# Patient Record
Sex: Male | Born: 1955 | Race: White | Hispanic: No | Marital: Married | Smoking: Never smoker
Health system: Southern US, Community
[De-identification: ages and names within clinical notes are randomized; demographics above are authoritative.]

## PROBLEM LIST (undated history)

## (undated) DIAGNOSIS — C801 Malignant (primary) neoplasm, unspecified: Secondary | ICD-10-CM

## (undated) DIAGNOSIS — F419 Anxiety disorder, unspecified: Secondary | ICD-10-CM

## (undated) DIAGNOSIS — M1990 Unspecified osteoarthritis, unspecified site: Secondary | ICD-10-CM

## (undated) HISTORY — PX: OTHER SURGICAL HISTORY: SHX169

## (undated) HISTORY — PX: CHOLECYSTECTOMY: SHX55

---

## 2020-05-24 ENCOUNTER — Other Ambulatory Visit: Payer: Self-pay | Admitting: Urology

## 2020-05-24 ENCOUNTER — Other Ambulatory Visit (HOSPITAL_COMMUNITY): Payer: Self-pay | Admitting: Urology

## 2020-05-24 DIAGNOSIS — C61 Malignant neoplasm of prostate: Secondary | ICD-10-CM

## 2020-06-29 ENCOUNTER — Other Ambulatory Visit: Payer: Self-pay

## 2020-06-29 ENCOUNTER — Encounter (HOSPITAL_COMMUNITY)
Admission: RE | Admit: 2020-06-29 | Discharge: 2020-06-29 | Disposition: A | Discharge status: Home / Self Care | Payer: Managed Care, Other (non HMO) | Source: Ambulatory Visit | Attending: Urology | Admitting: Urology

## 2020-06-29 ENCOUNTER — Ambulatory Visit
Admission: RE | Admit: 2020-06-29 | Discharge: 2020-06-29 | Disposition: A | Discharge status: Home / Self Care | Payer: Managed Care, Other (non HMO) | Source: Ambulatory Visit | Attending: Urology | Admitting: Urology

## 2020-06-29 DIAGNOSIS — C61 Malignant neoplasm of prostate: Secondary | ICD-10-CM | POA: Diagnosis present

## 2020-06-29 MED ORDER — GADOBENATE DIMEGLUMINE 529 MG/ML IV SOLN
19.0000 mL | Freq: Once | INTRAVENOUS | Status: AC | PRN
  Administered 2020-06-29: 19 mL via INTRAVENOUS

## 2020-06-29 MED ORDER — TECHNETIUM TC 99M MEDRONATE IV KIT
20.0000 | PACK | Freq: Once | INTRAVENOUS | Status: AC | PRN
  Administered 2020-06-29: 20 via INTRAVENOUS

## 2020-07-19 ENCOUNTER — Other Ambulatory Visit: Payer: Self-pay | Admitting: Urology

## 2020-07-23 ENCOUNTER — Other Ambulatory Visit: Payer: Self-pay

## 2020-07-23 ENCOUNTER — Encounter: Payer: Self-pay | Admitting: Physical Therapy

## 2020-07-23 ENCOUNTER — Ambulatory Visit: Payer: Managed Care, Other (non HMO) | Attending: Urology | Admitting: Physical Therapy

## 2020-07-23 DIAGNOSIS — R279 Unspecified lack of coordination: Secondary | ICD-10-CM | POA: Diagnosis present

## 2020-07-23 DIAGNOSIS — M6281 Muscle weakness (generalized): Secondary | ICD-10-CM | POA: Diagnosis not present

## 2020-07-23 NOTE — Patient Instructions (Addendum)
When to call the doctor 1. Blood in the urine 2. Increased pain in right leg at night  Bed Positioning after surgery 1. Pillows to support right hip and knee   After surgery- ice to area for 10 min 3 times per day  Self massage to perineum  Sitting posture  Resume: Gluteal squeeze, pelvic floor exercise, transverse abdominal contraction  Prior to surgery -Hamstring stretch -piriformis -butterfly stretch -Bridge -pelvic floor exercise laying down and sitting  Toileting Techniques for Bowel Movements (Defecation)  Using your belly (abdomen) and pelvic floor muscles to have a bowel movement is usually instinctive.  Sometimes people can have problems with these muscles and have to relearn proper defecation (emptying) techniques.  If you have weakness in your muscles, organs that are falling out, decreased sensation in your pelvis, or ignore your urge to go, you may find yourself straining to have a bowel movement.  You are straining if you are:  . holding your breath or taking in a huge gulp of air and holding it  . keeping your lips and jaw tensed and closed tightly . turning red in the face because of excessive pushing or forcing . developing or worsening your  hemorrhoids . getting faint while pushing . not emptying completely and have to defecate many times a day  If you are straining, you are actually making it harder for yourself to have a bowel movement.  Many people find they are pulling up with the pelvic floor muscles and closing off instead of opening the anus. Due to lack pelvic floor relaxation and coordination the abdominal muscles, one has to work harder to push the feces out.  Many people have never been taught how to defecate efficiently and effectively.  Notice what happens to your body when you are having a bowel movement.  While you are sitting on the toilet pay attention to the following areas: . Jaw and mouth position . Angle of your hips   . Whether your feet  touch the ground or not . Arm placement . Spine position . Waist . Belly tension . Anus (opening of the anal canal)  An Evacuation/Defecation Plan   Here are the 4 basic points:  1. Lean forward enough for your elbows to rest on your knees 2. Support your feet on the floor or use a low stool if your feet don't touch the floor  3. Push out your belly as if you have swallowed a beach ball--you should feel a widening of your waist 4. Open and relax your pelvic floor muscles, rather than tightening around the anus   The following conditions my require modifications to your toileting posture:  . If you have had surgery in the past that limits your back, hip, pelvic, knee or ankle flexibility . Constipation   Your healthcare practitioner may make the following additional suggestions and adjustments:  1) Sit on the toilet  a) Make sure your feet are supported. b) Notice your hip angle and spine position--most people find it effective to lean forward or raise their knees, which can help the muscles around the anus to relax  c) When you lean forward, place your forearms on your thighs for support  2) Relax suggestions a) Breath deeply in through your nose and out slowly through your mouth as if you are smelling the flowers and blowing out the candles. b) To become aware of how to relax your muscles, contracting and releasing muscles can be helpful.  Pull your pelvic floor  muscles in tightly by using the image of holding back gas, or closing around the anus (visualize making a circle smaller) and lifting the anus up and in.  Then release the muscles and your anus should drop down and feel open. Repeat 5 times ending with the feeling of relaxation. Concentric Contraction (Hook-Lying)  c) Keep your pelvic floor muscles relaxed; let your belly bulge out. d) The digestive tract starts at the mouth and ends at the anal opening, so be sure to relax both ends of the tube.  Place your tongue on the  roof of your mouth with your teeth separated.  This helps relax your mouth and will help to relax the anus at the same time.  3) Empty (defecation) a) Keep your pelvic floor and sphincter relaxed, then bulge your anal muscles.  Make the anal opening wide.  b) Stick your belly out as if you have swallowed a beach ball. c) Make your belly wall hard using your belly muscles while continuing to breathe. Doing this makes it easier to open your anus. d) Breath out and give a grunt (or try using other sounds such as ahhhh, shhhhh, ohhhh or grrrrrrr).  4) Finish a) As you finish your bowel movement, pull the pelvic floor muscles up and in.  This will leave your anus in the proper place rather than remaining pushed out and down. If you leave your anus pushed out and down, it will start to feel as though that is normal and give you incorrect signals about needing to have a bowel movement.   2007, [employer] Doc.23 Concentric Contraction (Hook-Lying)   Lie with hips and knees bent. Slowly inhale, and then exhale. Pull navel toward spine. Contract pelvic floor. Hold 5 seconds  Relax. Repeat _5__ times. Do _5__ times a day. Perform in sittting 5 times hold 5 seconds 2 times per day  Copyright  VHI. All rights reserved.  Buttocks Squeeze   Sitting, lying or standing, squeeze buttocks together while counting out loud to 5. Repeat _5___ times. Do __5__ sessions per day.  http://gt2.exer.us/561   Copyright  VHI. All rights reserved.    Lay on your back with legs crossed and put ball between ankles. Squeeze for 5 seconds 5x, 2x/day  Hip Stretch   Put right ankle over left knee. Let right knee fall downward, but keep ankle in place. Feel the stretch in hip. May push down gently with hand to feel stretch. Hold _5___ seconds while counting out loud. Repeat with other leg. Do laying on back with knees bent. Repeat __5__ times. Do _2___ sessions per day.  http://gt2.exer.us/497   Hamstring Stretch (Sitting)   Sitting, 2 legs on floor,  extend one leg and place hands on same thigh for support. Keeping torso straight, lean forward, sliding hands down leg, until a stretch is felt in back of thigh. Hold _30___ seconds. 2 times 2 times per day Repeat with other leg.  Copyright  VHI. All rights reserved.    Copyright  VHI. All rights reserved.  External Rotation: Hip - Knees Apart With Pelvic Floor (Hook-Lying)   Lie with hips and knees bent, band tied just above knees. Squeeze pelvic floor while pulling knees apart. Hold for _1__ seconds. Rest for _1__ seconds. Repeat _5__ times. Do _2  Copyright  VHI. All rights reserved. Bridging   Slowly raise buttocks from floor, keeping stomach tight. Repeat _10___ times per set. Do __1__ sets per session. Do __2__ sessions per day. Then place pillow  between knees, contract pelvic floor, lift hips up then down. 10x 2 times per day.  http://orth.exer.us/1096   Copyright  VHI. All rights reserved.   Butterfly, Supine    Lie on back, feet together. Lower knees toward floor. Hold _1 min. Repeat __1_ times per session. Do _1__ sessions per day.  Copyright  VHI. All rights reserved. The Male Pelvic Floor Muscles  The pelvic floor consists of several layers of muscles that cover the bottom of the pelvic cavity. These muscles have several distinct roles:  1. To support the pelvic organs, the bladder and colon within the pelvis. 2. To assist in stopping and starting the flow of urine or the passage of gas or stool. 3. To aid in sexual appreciation.    How to Locate the Pelvic Floor Muscles  The Urine Stop Test . At the midstream of your urine flow, squeeze the pelvic floor muscles. You should feel the sensation of the openings close and the muscles pulling the penis and anus up and in to the pelvic cavity.  If you have strong muscles you will slow or stop the stream of urine. . Try to stop or slow the flow of  urine without tensing the muscles of your legs, buttocks. . Do this only to locate the muscles, not as a daily exercise. Feeling the Muscle . Place a fingertip on or into the rectal opening.  Contract and lift the muscles as though you were holding back gas or a bowel movement.   . You will feel your anal opening tighten and your penis move slightly. Watching the Muscles Contract . Begin by lying on a flat surface.  Position yourself with your knees apart and bent with your head elevated and supported on several pillows.  Use a mirror to look at the anal opening and penis.  . Contract or tighten the muscles around the anal opening and watch for a puckering and lifting of the anus and slight movement of the penis.   . If you see a bulge of your anus this is an incorrect contraction and you should notify your health care provider for more instructions.   2007, [employer] Doc.12 Patient was verbally instructed on bladder irritants and how they can affect the bladder. See handout on Pre-Prostectomy program.  Patient returned all of the above exercises correctly.    Penile Pump Protocol Prior to placing pump on penis, retract the foreskin and shave the pubic hair Place water based lubricant only on the ring.  Place pump over the penis to the base.  Use slow gentle compressions till erection Maintain erection for 5 seconds then release, So for 10-20 erections for 10 min per day for 4 weeks Then progress to  3 minutes on, 1 minute off, 3 repetitions of this sequence for 3 times per week up to 12 months For post-prostatectomy, use as early as 4 weeks post-operatively, up to 12 months  Device may be covered by insurance if billed for "penile rehabilitation"  The device is to stretch and increase strength of penis You Tube vides "Physiotherapy Penile Rehab for Erectile Dysfunction ( use of vacuum pump)  Types of devices Penile pumps  4. Vacurect Vacuum Therapy Erectile  Dysfunction Device  by Vacurect Can get on [employer] .  5. Active Erection System [email or web address].pl   Clamp Protocol 1.  Fit penile clamp-Weisner or Dribblestop 2. Wear all day  during the waking hours ONLY. Do not wear when sleeping. 3. Place clamp just before the  glans penis (head of penis) 4. Wear for a minimum of 2 hours, max 4 hours 5. Wear 6 days/week, with one day off to re-assess 6. Bladder Chart on the day off 7. On day off see how long you can go without leaking but continue urinating 2 hours  8. When weaning off the clamp wear a pad just in case of leakage. 9. Wear 4-6 weeks, , wean off pads 10. May use in combination of medication that doctor may give you

## 2020-07-23 NOTE — Therapy (Addendum)
[facility] [address] [place], [place], [address] Phone: [phone]   Fax:  [phone]  Physical Therapy Evaluation  Patient Details  Name: Walter Miranda MRN: [id number] Date of Birth: 06/13/1956 Referring Provider (PT): Raynelle Bring, MD   Encounter Date: 07/23/2020   PT End of Session - 07/23/20 1722    Visit Number 1    Date for PT Re-Evaluation 11/12/20    Authorization Type cigna 40.00 copay; lives 1-2hrs away    PT Start Time 1533    PT Stop Time 1614    PT Time Calculation (min) 41 min    Activity Tolerance Patient tolerated treatment well    Behavior During Therapy [facility] for tasks assessed/performed           History reviewed. No pertinent past medical history.  Past Surgical History:  Procedure Laterality Date  . galbladder surgery      There were no vitals filed for this visit.    Subjective Assessment - 07/24/20 1525    Subjective Pt is here due to upcoming prostatectomy 08/09/20 and wants to know how to work on strength for best functional outcomes. Pt denies any pain or bowel/bladder symptoms currently.  Pt has chronic low back pain that is not bothering him currently.    Pertinent History DDD, chronic LBP    Patient Stated Goals learn how to strengthen pelvic floor keep from having leakage    Currently in Pain? 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     Pelvic Floor Special Questions - 07/24/20 0001    Urinary Leakage No    Urinary urgency No    Urinary frequency n    External Palpation palpated external to clothing - able to contract and relax; contract and hold x 5 sec    Exam Type Deferred  [facility] PT Treatment/Exercise - 07/24/20 0001      Self-Care   Self-Care Other Self-Care Comments    Other Self-Care Comments  preprostatectomy info, clamp and pump protocol; stretch and exercises edu and performed                  PT Education - 07/24/20 1550    Education Details prostatectomy info, clam and pump info, HEP strengthen and stretch info            PT Short Term Goals - 07/24/20 1538      PT SHORT TERM GOAL #1   Title STG=LTG due to leaving for surgery next 6-8 weeks             PT Long Term Goals - 07/24/20 1527      PT LONG TERM GOAL #1   Title ind with advnaced HEP    Time 16    Period Weeks    Status New    Target Date 11/12/20      PT LONG TERM GOAL #2   Title Pt will report no urinary leakage during typical daily activities    Time 16    Period Weeks    Status New    Target Date 11/12/20      PT LONG TERM GOAL #3   Title Pt will report no flare up of low back pain due to good core and pelvic floor activation     Time 16    Period Weeks    Status New    Target Date 11/12/20      PT LONG TERM GOAL #4   Title Pt will be able to sleep through the night only waking to urinate 1x/night or less    Time 16    Period Weeks    Status New    Target Date 11/12/20      PT LONG TERM GOAL #5   Time --    Period --    Status --    Target Date --                  Plan - 07/24/20 1538    Clinical Impression Statement Pt presents to clinic without knowledge of exercises and things to do pre and post prostatectomy surgery.  He was given handouts and education today. Assessment included hip and back mobility.  Strength and muscle length testing.  Pelvic floor assessment was done outside of clothing.  Pt can perform pelvic floor contraction correctly and relax after doing 5 reps of 5 sec hold. Pt able to feel what he is doing in order to perform pre surgery exercises.  Pt also demonstrates Rt hip abduction and adduction 4+/5; h/s length 80%.  He has rounded shoulders and slumped posture in sitting.  Pt haship rotation Rt side 25% limited and Lt side 20% limited.  Pt has tension in bilateral gluteals.  He will benefit from skilled PT after surgery to address impairments and ensure he is able to maintain stength for continued continence post surgery.    Personal Factors and Comorbidities Comorbidity 3+;Transportation    Comorbidities galbladder surgery; chronic low back pain; DDD; prostate cancer    Examination-Activity Limitations Toileting;Continence    Stability/Clinical Decision Making Evolving/Moderate complexity    Clinical Decision Making Low    Rehab Potential Excellent    PT Frequency 1x / week    PT Duration Other (comment)   16 weeks   PT Treatment/Interventions ADLs/Self Care Home Management;Biofeedback;Cryotherapy;Dealer  Stimulation;Moist Heat;Therapeutic activities;Therapeutic exercise;Neuromuscular re-education;Taping;Manual techniques;Patient/family education;Dry needling;Passive range  of motion    PT Next Visit Plan review HEP and re-assess post surgery as needed    PT Home Exercise Plan HEP provided           Patient will benefit from skilled therapeutic intervention in order to improve the following deficits and impairments:  Pain, Impaired flexibility, Increased fascial restricitons, Decreased strength, Decreased coordination, Decreased range of motion  Visit Diagnosis: Muscle weakness (generalized)  Unspecified lack of coordination     Problem List There are no problems to display for this patient.   Jule Ser, PT 07/24/2020, 5:09 PM  [facility] [address] [place], [place], [address] Phone: [phone]   Fax:  [phone]  Name: Walter Miranda MRN: [id number] Date of Birth: 02-16-1956

## 2020-07-24 NOTE — Addendum Note (Signed)
Addended by: Su Hoff on: 07/24/2020 05:11 PM   Modules accepted: Orders

## 2020-08-02 NOTE — Progress Notes (Addendum)
PCP - Emelda Fear, DO Cardiologist - no  PPM/ICD -  Device Orders -  Rep Notified -   Chest x-ray -  EKG - 08-06-20  Stress Test -  ECHO -  Cardiac Cath -   Sleep Study -  CPAP -   Fasting Blood Sugar -  Checks Blood Sugar _____ times a day  Blood Thinner Instructions: Aspirin Instructions:  ERAS Protcol - PRE-SURGERY Ensure or G2-   COVID TEST- 8-9   Activity -Can do own yard work and walk to [employer] without SOB  Anesthesia review:   Patient denies shortness of breath, fever, cough and chest pain at PAT appointment   All instructions explained to the patient, with a verbal understanding of the material. Patient agrees to go over the instructions while at home for a better understanding. Patient also instructed to self quarantine after being tested for COVID-19. The opportunity to ask questions was provided.

## 2020-08-02 NOTE — Patient Instructions (Addendum)
DUE TO COVID-19 ONLY ONE VISITOR IS ALLOWED TO COME WITH YOU AND STAY IN THE WAITING ROOM ONLY DURING PRE OP AND PROCEDURE DAY OF SURGERY. ONE  VISITOR  MAY VISIT WITH YOU AFTER SURGERY IN YOUR PRIVATE ROOM DURING VISITING HOURS ONLY!  10-a-8p  YOU NEED TO HAVE A COVID 19 TEST ON__8-9-21_____ @_______ , THIS TEST MUST BE DONE BEFORE SURGERY,  COVID TESTING SITE [address] [place] [address], IT IS ON THE RIGHT GOING OUT [address] APPROXIMATELY  2 MINUTES PAST [employer] ON THE RIGHT. ONCE YOUR COVID TEST IS COMPLETED,  PLEASE BEGIN THE QUARANTINE INSTRUCTIONS AS OUTLINED IN YOUR HANDOUT.                Walter Miranda  08/02/2020   Your procedure is scheduled on: 08-09-20   Report to [facility] Main  Entrance   Report to admitting at     0530 AM     Call this number if you have problems the morning of surgery [phone]    Remember:    8 oz of magnesium citrate by noon the day before surgery .                                        And one fleets enema the night before surgery                                         Do not eat food or drink liquids :After Midnight.   BRUSH YOUR TEETH MORNING OF SURGERY AND RINSE YOUR MOUTH OUT, NO CHEWING GUM CANDY OR MINTS.     Take these medicines the morning of surgery with A SIP OF WATER: cymbalta                                 You may not have any metal on your body including hair pins and              piercings  Do not wear jewelry, , lotions, powders or perfumes, deodorant              Men may shave face and neck.   Do not bring valuables to the hospital. [place].  Contacts, dentures or bridgework may not be worn into surgery.      Patients discharged the day of surgery will not be allowed to drive home. IF YOU ARE HAVING SURGERY AND GOING HOME THE SAME DAY, YOU MUST HAVE AN ADULT TO DRIVE YOU HOME AND BE WITH YOU FOR 24 HOURS. YOU MAY GO HOME BY TAXI  OR UBER OR ORTHERWISE, BUT AN ADULT MUST ACCOMPANY YOU HOME AND STAY WITH YOU FOR 24 HOURS.  Name and phone number of your driver:  Special Instructions: N/A              Please read over the following fact sheets you were given: _____________________________________________________________________             [facility] - Preparing for Surgery Before surgery, you can play an important role.  Because skin is not sterile, your skin needs  to be as free of germs as possible.  You can reduce the number of germs on your skin by washing with CHG (chlorahexidine gluconate) soap before surgery.  CHG is an antiseptic cleaner which kills germs and bonds with the skin to continue killing germs even after washing. Please DO NOT use if you have an allergy to CHG or antibacterial soaps.  If your skin becomes reddened/irritated stop using the CHG and inform your nurse when you arrive at Short Stay. Do not shave (including legs and underarms) for at least 48 hours prior to the first CHG shower.  You may shave your face/neck. Please follow these instructions carefully:  1.  Shower with CHG Soap the night before surgery and the  morning of Surgery.  2.  If you choose to wash your hair, wash your hair first as usual with your  normal  shampoo.  3.  After you shampoo, rinse your hair and body thoroughly to remove the  shampoo.                           4.  Use CHG as you would any other liquid soap.  You can apply chg directly  to the skin and wash                       Gently with a scrungie or clean washcloth.  5.  Apply the CHG Soap to your body ONLY FROM THE NECK DOWN.   Do not use on face/ open                           Wound or open sores. Avoid contact with eyes, ears mouth and genitals (private parts).                       Wash face,  Genitals (private parts) with your normal soap.             6.  Wash thoroughly, paying special attention to the area where your surgery  will be performed.  7.   Thoroughly rinse your body with warm water from the neck down.  8.  DO NOT shower/wash with your normal soap after using and rinsing off  the CHG Soap.                9.  Pat yourself dry with a clean towel.            10.  Wear clean pajamas.            11.  Place clean sheets on your bed the night of your first shower and do not  sleep with pets. Day of Surgery : Do not apply any lotions/deodorants the morning of surgery.  Please wear clean clothes to the hospital/surgery center.  FAILURE TO FOLLOW THESE INSTRUCTIONS MAY RESULT IN THE CANCELLATION OF YOUR SURGERY PATIENT SIGNATURE_________________________________  NURSE SIGNATURE__________________________________  ________________________________________________________________________   Walter Miranda  An incentive spirometer is a tool that can help keep your lungs clear and active. This tool measures how well you are filling your lungs with each breath. Taking long deep breaths may help reverse or decrease the chance of developing breathing (pulmonary) problems (especially infection) following:  A long period of time when you are unable to move or be active. BEFORE THE PROCEDURE   If the spirometer includes an indicator to show your  best effort, your nurse or respiratory therapist will set it to a desired goal.  If possible, sit up straight or lean slightly forward. Try not to slouch.  Hold the incentive spirometer in an upright position. INSTRUCTIONS FOR USE  1. Sit on the edge of your bed if possible, or sit up as far as you can in bed or on a chair. 2. Hold the incentive spirometer in an upright position. 3. Breathe out normally. 4. Place the mouthpiece in your mouth and seal your lips tightly around it. 5. Breathe in slowly and as deeply as possible, raising the piston or the ball toward the top of the column. 6. Hold your breath for 3-5 seconds or for as long as possible. Allow the piston or ball to fall to the bottom  of the column. 7. Remove the mouthpiece from your mouth and breathe out normally. 8. Rest for a few seconds and repeat Steps 1 through 7 at least 10 times every 1-2 hours when you are awake. Take your time and take a few normal breaths between deep breaths. 9. The spirometer may include an indicator to show your best effort. Use the indicator as a goal to work toward during each repetition. 10. After each set of 10 deep breaths, practice coughing to be sure your lungs are clear. If you have an incision (the cut made at the time of surgery), support your incision when coughing by placing a pillow or rolled up towels firmly against it. Once you are able to get out of bed, walk around indoors and cough well. You may stop using the incentive spirometer when instructed by your caregiver.  RISKS AND COMPLICATIONS  Take your time so you do not get dizzy or light-headed.  If you are in pain, you may need to take or ask for pain medication before doing incentive spirometry. It is harder to take a deep breath if you are having pain. AFTER USE  Rest and breathe slowly and easily.  It can be helpful to keep track of a log of your progress. Your caregiver can provide you with a simple table to help with this. If you are using the spirometer at home, follow these instructions: [place] IF:   You are having difficultly using the spirometer.  You have trouble using the spirometer as often as instructed.  Your pain medication is not giving enough relief while using the spirometer.  You develop fever of 100.5 F (38.1 C) or higher. SEEK IMMEDIATE MEDICAL CARE IF:   You cough up bloody sputum that had not been present before.  You develop fever of 102 F (38.9 C) or greater.  You develop worsening pain at or near the incision site. MAKE SURE YOU:   Understand these instructions.  Will watch your condition.  Will get help right away if you are not doing well or get worse. Document  Released: 04/27/2007 Document Revised: 03/08/2012 Document Reviewed: 06/28/2007 [facility] Patient Information 2014 [place], [place].   ________________________________________________________________________

## 2020-08-06 ENCOUNTER — Other Ambulatory Visit: Payer: Self-pay

## 2020-08-06 ENCOUNTER — Other Ambulatory Visit (HOSPITAL_COMMUNITY)
Admission: RE | Admit: 2020-08-06 | Discharge: 2020-08-06 | Disposition: A | Discharge status: Home / Self Care | Payer: Managed Care, Other (non HMO) | Source: Ambulatory Visit | Attending: Urology | Admitting: Urology

## 2020-08-06 ENCOUNTER — Encounter (HOSPITAL_COMMUNITY)
Admission: RE | Admit: 2020-08-06 | Discharge: 2020-08-06 | Disposition: A | Discharge status: Home / Self Care | Payer: Managed Care, Other (non HMO) | Source: Ambulatory Visit | Attending: Urology | Admitting: Urology

## 2020-08-06 ENCOUNTER — Encounter (HOSPITAL_COMMUNITY): Payer: Self-pay

## 2020-08-06 DIAGNOSIS — Z20822 Contact with and (suspected) exposure to covid-19: Secondary | ICD-10-CM | POA: Diagnosis not present

## 2020-08-06 DIAGNOSIS — Z01818 Encounter for other preprocedural examination: Secondary | ICD-10-CM | POA: Insufficient documentation

## 2020-08-06 HISTORY — DX: Anxiety disorder, unspecified: F41.9

## 2020-08-06 HISTORY — DX: Unspecified osteoarthritis, unspecified site: M19.90

## 2020-08-06 HISTORY — DX: Malignant (primary) neoplasm, unspecified: C80.1

## 2020-08-06 LAB — CBC
HCT: 42.7 % (ref 39.0–52.0)
Hemoglobin: 14.6 g/dL (ref 13.0–17.0)
MCH: 34 pg (ref 26.0–34.0)
MCHC: 34.2 g/dL (ref 30.0–36.0)
MCV: 99.5 fL (ref 80.0–100.0)
Platelets: 185 10*3/uL (ref 150–400)
RBC: 4.29 MIL/uL (ref 4.22–5.81)
RDW: 13.7 % (ref 11.5–15.5)
WBC: 5.9 10*3/uL (ref 4.0–10.5)
nRBC: 0 % (ref 0.0–0.2)

## 2020-08-06 LAB — BASIC METABOLIC PANEL
Anion gap: 9 (ref 5–15)
BUN: 15 mg/dL (ref 8–23)
CO2: 27 mmol/L (ref 22–32)
Calcium: 9.2 mg/dL (ref 8.9–10.3)
Chloride: 105 mmol/L (ref 98–111)
Creatinine, Ser: 0.95 mg/dL (ref 0.61–1.24)
GFR calc Af Amer: >60 mL/min (ref >60)
GFR calc non Af Amer: >60 mL/min (ref >60)
Glucose, Bld: 102 mg/dL — ABNORMAL HIGH (ref 70–99)
Potassium: 4.1 mmol/L (ref 3.5–5.1)
Sodium: 141 mmol/L (ref 135–145)

## 2020-08-06 LAB — SARS CORONAVIRUS 2 (TAT 6-24 HRS): SARS Coronavirus 2: NEGATIVE

## 2020-08-08 NOTE — H&P (Signed)
Office Visit Report     07/17/2020   --------------------------------------------------------------------------------   Walter Miranda  MRN: [id number]  DOB: 06/14/1956, 64 year old Male  SSN: [id number]   PRIMARY CARE:  Mel Almond, DO  REFERRING:  Aloha Gell. Hurt, MD  PROVIDER:  Raynelle Bring, M.D.  LOCATION:  [facility] [phone]     --------------------------------------------------------------------------------   CC/HPI: Prostate cancer   PCP: Dr. Cordella Register   Walter Miranda is a 64 year old gentleman who originally saw me in August of 2016 in consultation for low risk prostate cancer. At that time, he had undergone a biopsy on 07/20/2015 for an elevated PSA of a 8.06. His digital rectal exam was normal. 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 We had an extensive discussion regarding options for management. He had undergone staging studies in [place] by Dr. Lerry Liner including a CT scan of the abdomen and pelvis and bone scan with no evidence of obvious metastatic disease. Ultimately, he did not proceed with additional urologic follow-up but has simply been undergoing PSA monitoring via his primary care physician. His PSA recently increased to 20.22 on 04/06/2020 prompting further urologic evaluation in May 2021. After further discussion, it was recommended that he undergo additional staging including an MRI of the prostate/pelvis and bone scan. His bone scan was negative for obvious metastatic disease. His MRI indicated evidence of a PI-RADS 5 lesion on the left side of the prostate with EPE and involvement of the NVB most likely. No SVI or LAD or bone lesions were noted.   He is potentially interested in proceeding with treatment if indicated. Of note, his recently had presyncopal episodes in his undergone an extensive evaluation including an MRI of the brain, carotid artery ultrasound, and echocardiogram. These tests were  all unremarkable for an explanation for these symptoms.   Past medical history: Rheumatoid arthritis treated with Enbrel and followed by Dr. Leigh Aurora.  Past surgical history: He has a history of a laparoscopic cholecystectomy.   Urinary function: IPSS is 9.  Erectile function: SHIM score is 18.     ALLERGIES: No Allergies    MEDICATIONS: Allergy Relief  Enbrel     GU PSH: No GU PSH      PSH Notes: Cholecystectomy, Wrist Surgery   NON-GU PSH: Cholecystectomy (open) - 2016     GU PMH: History of prostate cancer, History of prostate cancer - 2016 Prostate Cancer, Prostate cancer - 2016    NON-GU PMH: Personal history of other diseases of the musculoskeletal system and connective tissue, History of arthritis - 2016, History of rheumatoid arthritis, - 2016 Arthritis Asthma    FAMILY HISTORY: Prostate Cancer - Runs In Family   SOCIAL HISTORY: Marital Status: Married Preferred Language: English; Ethnicity: Not Hispanic Or Latino; Race: White Current Smoking Status: Patient has never smoked.   Tobacco Use Assessment Completed: Used Tobacco in last 30 days? Drinks 2 drinks per day. Social Drinker.  Drinks 1 caffeinated drink per day.     Notes: Married, Occupation, Two children, Never a smoker, Alcohol use   REVIEW OF SYSTEMS:    GU Review Male:   Patient denies frequent urination, hard to postpone urination, burning/ pain with urination, get up at night to urinate, leakage of urine, stream starts and stops, trouble starting your streams, and have to strain to urinate .  Gastrointestinal (Upper):   Patient denies nausea and vomiting.  Gastrointestinal (Lower):   Patient  denies diarrhea and constipation.  Constitutional:   Patient denies fever, night sweats, weight loss, and fatigue.  Skin:   Patient denies skin rash/ lesion and itching.  Eyes:   Patient denies blurred vision and double vision.  Ears/ Nose/ Throat:   Patient denies sore throat and sinus problems.   Hematologic/Lymphatic:   Patient denies swollen glands and easy bruising.  Cardiovascular:   Patient denies leg swelling and chest pains.  Respiratory:   Patient denies cough and shortness of breath.  Endocrine:   Patient denies excessive thirst.  Musculoskeletal:   Patient denies back pain and joint pain.  Neurological:   Patient denies dizziness and headaches.  Psychologic:   Patient denies depression and anxiety.   VITAL SIGNS:      07/17/2020 08:10 AM  Weight 195 lb / 88.45 kg  Height 72 in / 182.88 cm  BP 132/72 mmHg  Pulse 72 /min  Temperature 97.3 F / 36.2 C  BMI 26.4 kg/m   MULTI-SYSTEM PHYSICAL EXAMINATION:    Constitutional: Well-nourished. No physical deformities. Normally developed. Good grooming.  Neck: Neck symmetrical, not swollen. Normal tracheal position.  Respiratory: No labored breathing, no use of accessory muscles. Clear bilaterally.  Cardiovascular: Normal temperature, normal extremity pulses, no swelling, no varicosities. Regular rate and rhythm.  Lymphatic: No enlargement of neck, axillae, groin.  Skin: No paleness, no jaundice, no cyanosis. No lesion, no ulcer, no rash.  Neurologic / Psychiatric: Oriented to time, oriented to place, oriented to person. No depression, no anxiety, no agitation.  Gastrointestinal: No mass, no tenderness, no rigidity, non obese abdomen. Small well-healed laparoscopic incisions.  Eyes: Normal conjunctivae. Normal eyelids.  Ears, Nose, Mouth, and Throat: Left ear no scars, no lesions, no masses. Right ear no scars, no lesions, no masses. Nose no scars, no lesions, no masses. Normal hearing. Normal lips.  Musculoskeletal: Normal gait and station of head and neck.     Complexity of Data:  Lab Test Review:   PSA  Records Review:   Pathology Reports, Previous Patient Records  X-Ray Review: MRI Prostate GSORAD: Reviewed Films.  Bone Scan: Reviewed Films.     05/22/20  PSA  Total PSA 21.40 ng/mL   Notes:                      CLINICAL DATA: Prostate carcinoma, Gleason score 3+3=6. Active  surveillance.   EXAM:  MR PROSTATE WITHOUT AND WITH CONTRAST   TECHNIQUE:  Multiplanar multisequence MRI images were obtained of the pelvis  centered about the prostate. Pre and post contrast images were  obtained.   CONTRAST: 39mL MULTIHANCE GADOBENATE DIMEGLUMINE 529 MG/ML IV SOLN   COMPARISON: None.   FINDINGS:  Prostate:   -- Peripheral Zone: Ill-defined T2 hypointense nodule is seen in the  left lateral apex and mid gland, which measures 1.7 by 1.3 x 0.8 cm.  This shows marked ADC hypointensity, moderate DWI hyperintensity,  and early focal contrast enhancement. No other suspicious peripheral  zone nodules are identified.   -- Transition/Central Zone: Normal appearance.   -- Measurements/Volume: 3.4 x 2.6 x 3.5 cm (volume = 16 cm^3)   Transcapsular spread: Extracapsular extension is seen in the left  posterolateral mid gland and apex.   Seminal vesicle involvement: Absent   Neurovascular bundle involvement: Probable involvement of the left  neurovascular bundle.   Pelvic adenopathy: None visualized   Bone metastasis: None visualized   Other: None   IMPRESSION:  1.7 cm peripheral zone nodule in  the left lateral apex and mid  gland, highly suspicious for high-grade carcinoma. PI-RADS 5: Very  high (clinically significant cancer is highly likely to be present).   Suspect left-sided extracapsular extension, and involvement of the  left neurovascular bundle.   No evidence of pelvic lymphadenopathy or bone metastases.   (I have processed this exam in the DynaCAD application for MR/US  fusion-guided biopsy if performed.)    Electronically Signed  By: Marlaine Hind M.D.  On: 07/04/2020 14:41   CLINICAL DATA: Prostate cancer. PSA is 21.4 on 05/23/2020.   EXAM:  NUCLEAR MEDICINE WHOLE BODY BONE SCAN   TECHNIQUE:  Whole body anterior and posterior images were obtained approximately  3 hours  after intravenous injection of radiopharmaceutical.   RADIOPHARMACEUTICALS: 18.5 mCi Technetium-60m MDP IV   COMPARISON: None   FINDINGS:  Symmetric activity within both kidneys. Activity within the bladder.  Small focus of increased osseous remodeling is identified along the  LEFT posterior 6th rib, at the costovertebral junction. The findings  are possibly related to degenerative changes. Other mildly increased  areas of increased osseous remodeling identified in the shoulders  and LEFT wrist, are likely degenerative.   IMPRESSION:  1. Focal activity in the LEFT posterior 6th costovertebral junction  is favored to represent degenerative changes.  2. No definitive evidence for osseous metastatic disease.    Electronically Signed  By: Nolon Nations M.D.  On: 06/30/2020 10:41   PROCEDURES:          Urinalysis - 81003 Dipstick Dipstick Cont'd  Color: Yellow Bilirubin: Neg mg/dL  Appearance: Clear Ketones: Neg mg/dL  Specific Gravity: 1.030 Blood: Neg ery/uL  pH: 6.0 Protein: Trace mg/dL  Glucose: Neg mg/dL Urobilinogen: 0.2 mg/dL    Nitrites: Neg    Leukocyte Esterase: Neg leu/uL    ASSESSMENT:      ICD-10 Details  1 GU:   Prostate Cancer - C61    PLAN:           Schedule Return Visit/Planned Activity: Other See Visit Notes             Note: Will call to schedule surgery  Return Visit/Planned Activity: Next Available Appointment - PT Referral             Note: Please schedule patient for preoperative PT prior to radical prostatectomy.            Document Letter(s):  Created for Patient: Clinical Summary         Notes:   1. Locally advanced prostate cancer: We reviewed his bone scan an MRI which fortunately do not demonstrate evidence of metastatic disease that do raise concern for clinically significant cancer with evidence of probable extraprostatic extension on the left consistent with locally advanced disease. As such, I have recommended that he proceed with  curative therapy at this point.   The patient was counseled about the natural history of prostate cancer and the standard treatment options that are available for prostate cancer. It was explained to him how his age and life expectancy, clinical stage, Gleason score, and PSA affect his prognosis, the decision to proceed with additional staging studies, as well as how that information influences recommended treatment strategies. We discussed the roles for active surveillance, radiation therapy, surgical therapy, androgen deprivation, as well as ablative therapy options for the treatment of prostate cancer as appropriate to his individual cancer situation. We discussed the risks and benefits of these options with regard to their impact on cancer control and  also in terms of potential adverse events, complications, and impact on quality of life particularly related to urinary and sexual function. The patient was encouraged to ask questions throughout the discussion today and all questions were answered to his stated satisfaction. In addition, the patient was provided with and/or directed to appropriate resources and literature for further education about prostate cancer and treatment options. We discussed surgical therapy for prostate cancer including the different available surgical approaches. We discussed, in detail, the risks and expectations of surgery with regard to cancer control, urinary control, and erectile function as well as the expected postoperative recovery process. Additional risks of surgery including but not limited to bleeding, infection, hernia formation, nerve damage, lymphocele formation, bowel/rectal injury potentially necessitating colostomy, damage to the urinary tract resulting in urine leakage, urethral stricture, and the cardiopulmonary risks such as myocardial infarction, stroke, death, venothromboembolism, etc. were explained. The risk of open surgical conversion for robotic/laparoscopic  prostatectomy was also discussed.   He does wish to proceed with surgical therapy. We did discuss the options including primary surgical therapy or long-term androgen deprivation with radiation. I did offer him a radiation oncology consultation but he has been very adamant about his decision to proceed with surgery. As such, he will be tentatively scheduled for a unilateral right nerve-sparing robot assisted laparoscopic radical prostatectomy and bilateral pelvic lymphadenectomy.   I will plan to talk with Dr. Amil Amen about perioperative management of his Enbrel but I imagine we would likely hold this for approximately 2 weeks preoperatively and postoperatively with plans to use alternative therapies for his rheumatoid arthritis around that time frame.   Cc: Dr. Emelda Fear  Dr. Leigh Aurora     E & M CODES: We spent 55 minutes dedicated to evaluation and management time, including face to face interaction, discussions on coordination of care, documentation, result review, and discussion with others as applicable.     * Signed by Raynelle Bring, M.D. on 07/17/20 at 3:27 PM (EDT)*       APPENDED NOTES:  I spoke with Dr. Amil Amen. Will plan to hold Enbrel 2 weeks prior to surgery and 2 weeks after. He can use anti-inflammatories as needed in between.     * Signed by Raynelle Bring, M.D. on 07/17/20 at 4:01 PM (EDT)*

## 2020-08-08 NOTE — Anesthesia Preprocedure Evaluation (Addendum)
Anesthesia Evaluation  Patient identified by MRN, date of birth, ID band Patient awake    Reviewed: Allergy & Precautions, H&P , NPO status , Patient's Chart, lab work & pertinent test results  Airway Mallampati: II  TM Distance: >3 FB Neck ROM: Full    Dental no notable dental hx. (+) Teeth Intact, Dental Advisory Given   Pulmonary neg pulmonary ROS,    Pulmonary exam normal breath sounds clear to auscultation       Cardiovascular Exercise Tolerance: Good negative cardio ROS   Rhythm:Regular Rate:Normal     Neuro/Psych Anxiety negative neurological ROS     GI/Hepatic negative GI ROS, Neg liver ROS,   Endo/Other  negative endocrine ROS  Renal/GU negative Renal ROS  negative genitourinary   Musculoskeletal  (+) Arthritis , Osteoarthritis,    Abdominal   Peds  Hematology negative hematology ROS (+)   Anesthesia Other Findings   Reproductive/Obstetrics negative OB ROS                            Anesthesia Physical Anesthesia Plan  ASA: II  Anesthesia Plan: General   Post-op Pain Management:    Induction: Intravenous  PONV Risk Score and Plan: 3 and Ondansetron, Dexamethasone and Midazolam  Airway Management Planned: Oral ETT  Additional Equipment:   Intra-op Plan:   Post-operative Plan: Extubation in OR  Informed Consent: I have reviewed the patients History and Physical, chart, labs and discussed the procedure including the risks, benefits and alternatives for the proposed anesthesia with the patient or authorized representative who has indicated his/her understanding and acceptance.       Plan Discussed with: CRNA and Surgeon  Anesthesia Plan Comments:        Anesthesia Quick Evaluation

## 2020-08-09 ENCOUNTER — Encounter (HOSPITAL_COMMUNITY): Payer: Self-pay | Admitting: Urology

## 2020-08-09 ENCOUNTER — Other Ambulatory Visit: Payer: Self-pay

## 2020-08-09 ENCOUNTER — Ambulatory Visit (HOSPITAL_COMMUNITY): Payer: Managed Care, Other (non HMO) | Admitting: Anesthesiology

## 2020-08-09 ENCOUNTER — Observation Stay (HOSPITAL_COMMUNITY)
Admission: RE | Admit: 2020-08-09 | Discharge: 2020-08-10 | Disposition: A | Discharge status: Home / Self Care | Payer: Managed Care, Other (non HMO) | Attending: Urology | Admitting: Urology

## 2020-08-09 ENCOUNTER — Encounter (HOSPITAL_COMMUNITY)
Admission: RE | Disposition: A | Discharge status: Home / Self Care | Payer: Self-pay | Source: Home / Self Care | Attending: Urology

## 2020-08-09 DIAGNOSIS — J45909 Unspecified asthma, uncomplicated: Secondary | ICD-10-CM | POA: Insufficient documentation

## 2020-08-09 DIAGNOSIS — C61 Malignant neoplasm of prostate: Secondary | ICD-10-CM | POA: Diagnosis present

## 2020-08-09 HISTORY — PX: ROBOT ASSISTED LAPAROSCOPIC RADICAL PROSTATECTOMY: SHX5141

## 2020-08-09 HISTORY — PX: LYMPHADENECTOMY: SHX5960

## 2020-08-09 LAB — TYPE AND SCREEN
ABO/RH(D): O POS
Antibody Screen: NEGATIVE

## 2020-08-09 LAB — ABO/RH: ABO/RH(D): O POS

## 2020-08-09 LAB — HEMOGLOBIN AND HEMATOCRIT, BLOOD
HCT: 37.5 % — ABNORMAL LOW (ref 39.0–52.0)
Hemoglobin: 12.6 g/dL — ABNORMAL LOW (ref 13.0–17.0)

## 2020-08-09 SURGERY — XI ROBOTIC ASSISTED LAPAROSCOPIC RADICAL PROSTATECTOMY LEVEL 2
Anesthesia: General | Site: Abdomen

## 2020-08-09 MED ORDER — FENTANYL CITRATE (PF) 100 MCG/2ML IJ SOLN
INTRAMUSCULAR | Status: AC
  Filled 2020-08-09: qty 2

## 2020-08-09 MED ORDER — LACTATED RINGERS IV SOLN
INTRAVENOUS | Status: DC | PRN
  Administered 2020-08-09: 1 mL

## 2020-08-09 MED ORDER — BELLADONNA ALKALOIDS-OPIUM 16.2-60 MG RE SUPP: 1.0000 | Freq: Four times a day (QID) | RECTAL | Status: DC | PRN

## 2020-08-09 MED ORDER — ONDANSETRON HCL 4 MG/2ML IJ SOLN
4.0000 mg | INTRAMUSCULAR | Status: DC | PRN
  Filled 2020-08-09: qty 2

## 2020-08-09 MED ORDER — MIDAZOLAM HCL 2 MG/2ML IJ SOLN
INTRAMUSCULAR | Status: AC
  Filled 2020-08-09: qty 2

## 2020-08-09 MED ORDER — TRAMADOL HCL 50 MG PO TABS: 50.0000 mg | ORAL_TABLET | Freq: Four times a day (QID) | ORAL | 0 refills | Status: DC | PRN

## 2020-08-09 MED ORDER — ROCURONIUM BROMIDE 10 MG/ML (PF) SYRINGE
PREFILLED_SYRINGE | INTRAVENOUS | Status: AC
  Filled 2020-08-09: qty 10

## 2020-08-09 MED ORDER — SODIUM CHLORIDE 0.9 % IR SOLN
Status: DC | PRN
  Administered 2020-08-09: 1000 mL

## 2020-08-09 MED ORDER — CEFAZOLIN SODIUM-DEXTROSE 2-4 GM/100ML-% IV SOLN
2.0000 g | Freq: Once | INTRAVENOUS | Status: AC
  Administered 2020-08-09: 2 g via INTRAVENOUS
  Filled 2020-08-09: qty 100

## 2020-08-09 MED ORDER — EPHEDRINE SULFATE-NACL 50-0.9 MG/10ML-% IV SOSY
PREFILLED_SYRINGE | INTRAVENOUS | Status: DC | PRN
  Administered 2020-08-09 (×2): 10 mg via INTRAVENOUS
  Administered 2020-08-09 (×2): 5 mg via INTRAVENOUS

## 2020-08-09 MED ORDER — ZOLPIDEM TARTRATE 5 MG PO TABS: 5.0000 mg | ORAL_TABLET | Freq: Every evening | ORAL | Status: DC | PRN

## 2020-08-09 MED ORDER — ONDANSETRON HCL 4 MG/2ML IJ SOLN
INTRAMUSCULAR | Status: DC | PRN
  Administered 2020-08-09: 4 mg via INTRAVENOUS

## 2020-08-09 MED ORDER — CEFAZOLIN SODIUM-DEXTROSE 1-4 GM/50ML-% IV SOLN
1.0000 g | Freq: Three times a day (TID) | INTRAVENOUS | Status: AC
  Administered 2020-08-09 – 2020-08-10 (×2): 1 g via INTRAVENOUS
  Filled 2020-08-09 (×2): qty 50

## 2020-08-09 MED ORDER — LACTATED RINGERS IV SOLN: INTRAVENOUS | Status: DC

## 2020-08-09 MED ORDER — BUPIVACAINE-EPINEPHRINE 0.25% -1:200000 IJ SOLN
INTRAMUSCULAR | Status: DC | PRN
  Administered 2020-08-09: 30 mL

## 2020-08-09 MED ORDER — CHLORHEXIDINE GLUCONATE CLOTH 2 % EX PADS
6.0000 | MEDICATED_PAD | Freq: Every day | CUTANEOUS | Status: DC
  Administered 2020-08-09 – 2020-08-10 (×2): 6 via TOPICAL

## 2020-08-09 MED ORDER — DULOXETINE HCL 60 MG PO CPEP
60.0000 mg | ORAL_CAPSULE | Freq: Every day | ORAL | Status: DC
  Administered 2020-08-10: 60 mg via ORAL
  Filled 2020-08-09: qty 1

## 2020-08-09 MED ORDER — MIDAZOLAM HCL 2 MG/2ML IJ SOLN
INTRAMUSCULAR | Status: DC | PRN
  Administered 2020-08-09: 2 mg via INTRAVENOUS

## 2020-08-09 MED ORDER — BUPIVACAINE-EPINEPHRINE (PF) 0.25% -1:200000 IJ SOLN
INTRAMUSCULAR | Status: AC
  Filled 2020-08-09: qty 30

## 2020-08-09 MED ORDER — CHLORHEXIDINE GLUCONATE 0.12 % MT SOLN
15.0000 mL | Freq: Once | OROMUCOSAL | Status: AC
  Administered 2020-08-09: 15 mL via OROMUCOSAL

## 2020-08-09 MED ORDER — ACETAMINOPHEN 325 MG PO TABS: 650.0000 mg | ORAL_TABLET | ORAL | Status: DC | PRN

## 2020-08-09 MED ORDER — FENTANYL CITRATE (PF) 250 MCG/5ML IJ SOLN
INTRAMUSCULAR | Status: AC
  Filled 2020-08-09: qty 5

## 2020-08-09 MED ORDER — MORPHINE SULFATE (PF) 4 MG/ML IV SOLN
2.0000 mg | INTRAVENOUS | Status: DC | PRN
  Administered 2020-08-09: 3 mg via INTRAVENOUS
  Administered 2020-08-09 (×2): 2 mg via INTRAVENOUS
  Filled 2020-08-09 (×3): qty 1

## 2020-08-09 MED ORDER — LIDOCAINE HCL (CARDIAC) PF 100 MG/5ML IV SOSY
PREFILLED_SYRINGE | INTRAVENOUS | Status: DC | PRN
  Administered 2020-08-09: 60 mg via INTRAVENOUS

## 2020-08-09 MED ORDER — MAGNESIUM CITRATE PO SOLN: 1.0000 | Freq: Once | ORAL | Status: DC

## 2020-08-09 MED ORDER — ROCURONIUM BROMIDE 10 MG/ML (PF) SYRINGE
PREFILLED_SYRINGE | INTRAVENOUS | Status: DC | PRN
  Administered 2020-08-09: 20 mg via INTRAVENOUS
  Administered 2020-08-09 (×2): 10 mg via INTRAVENOUS
  Administered 2020-08-09: 60 mg via INTRAVENOUS

## 2020-08-09 MED ORDER — HYDRALAZINE HCL 20 MG/ML IJ SOLN
INTRAMUSCULAR | Status: DC | PRN
  Administered 2020-08-09 (×2): 5 mg via INTRAVENOUS

## 2020-08-09 MED ORDER — DOCUSATE SODIUM 100 MG PO CAPS
100.0000 mg | ORAL_CAPSULE | Freq: Two times a day (BID) | ORAL | Status: DC
  Administered 2020-08-09 – 2020-08-10 (×2): 100 mg via ORAL
  Filled 2020-08-09 (×2): qty 1

## 2020-08-09 MED ORDER — DIPHENHYDRAMINE HCL 50 MG/ML IJ SOLN: 12.5000 mg | Freq: Four times a day (QID) | INTRAMUSCULAR | Status: DC | PRN

## 2020-08-09 MED ORDER — SUGAMMADEX SODIUM 200 MG/2ML IV SOLN
INTRAVENOUS | Status: DC | PRN
  Administered 2020-08-09: 180 mg via INTRAVENOUS

## 2020-08-09 MED ORDER — BACITRACIN-NEOMYCIN-POLYMYXIN 400-5-5000 EX OINT: 1.0000 "application " | TOPICAL_OINTMENT | Freq: Three times a day (TID) | CUTANEOUS | Status: DC | PRN

## 2020-08-09 MED ORDER — KCL IN DEXTROSE-NACL 20-5-0.45 MEQ/L-%-% IV SOLN
INTRAVENOUS | Status: DC
  Filled 2020-08-09 (×3): qty 1000

## 2020-08-09 MED ORDER — LIDOCAINE 2% (20 MG/ML) 5 ML SYRINGE
INTRAMUSCULAR | Status: AC
  Filled 2020-08-09: qty 5

## 2020-08-09 MED ORDER — ACETAMINOPHEN 500 MG PO TABS
1000.0000 mg | ORAL_TABLET | Freq: Once | ORAL | Status: AC
  Administered 2020-08-09: 1000 mg via ORAL
  Filled 2020-08-09: qty 2

## 2020-08-09 MED ORDER — HYDROMORPHONE HCL 1 MG/ML IJ SOLN
0.2500 mg | INTRAMUSCULAR | Status: DC | PRN
  Administered 2020-08-09: 0.5 mg via INTRAVENOUS

## 2020-08-09 MED ORDER — DIPHENHYDRAMINE HCL 12.5 MG/5ML PO ELIX: 12.5000 mg | ORAL_SOLUTION | Freq: Four times a day (QID) | ORAL | Status: DC | PRN

## 2020-08-09 MED ORDER — KETOROLAC TROMETHAMINE 15 MG/ML IJ SOLN
15.0000 mg | Freq: Four times a day (QID) | INTRAMUSCULAR | Status: DC
  Administered 2020-08-09 – 2020-08-10 (×4): 15 mg via INTRAVENOUS
  Filled 2020-08-09 (×4): qty 1

## 2020-08-09 MED ORDER — HEPARIN SODIUM (PORCINE) 1000 UNIT/ML IJ SOLN
INTRAMUSCULAR | Status: AC
  Filled 2020-08-09: qty 1

## 2020-08-09 MED ORDER — SODIUM CHLORIDE 0.9 % IV BOLUS
1000.0000 mL | Freq: Once | INTRAVENOUS | Status: AC
  Administered 2020-08-09: 1000 mL via INTRAVENOUS

## 2020-08-09 MED ORDER — HYDROMORPHONE HCL 1 MG/ML IJ SOLN
INTRAMUSCULAR | Status: AC
  Filled 2020-08-09: qty 1

## 2020-08-09 MED ORDER — HYDRALAZINE HCL 20 MG/ML IJ SOLN
INTRAMUSCULAR | Status: AC
  Filled 2020-08-09: qty 1

## 2020-08-09 MED ORDER — PROPOFOL 10 MG/ML IV BOLUS
INTRAVENOUS | Status: DC | PRN
  Administered 2020-08-09: 150 mg via INTRAVENOUS

## 2020-08-09 MED ORDER — EPHEDRINE 5 MG/ML INJ
INTRAVENOUS | Status: AC
  Filled 2020-08-09: qty 10

## 2020-08-09 MED ORDER — FENTANYL CITRATE (PF) 250 MCG/5ML IJ SOLN
INTRAMUSCULAR | Status: DC | PRN
  Administered 2020-08-09 (×3): 50 ug via INTRAVENOUS
  Administered 2020-08-09 (×2): 100 ug via INTRAVENOUS

## 2020-08-09 MED ORDER — PROPOFOL 10 MG/ML IV BOLUS
INTRAVENOUS | Status: AC
  Filled 2020-08-09: qty 40

## 2020-08-09 MED ORDER — SULFAMETHOXAZOLE-TRIMETHOPRIM 800-160 MG PO TABS
1.0000 | ORAL_TABLET | Freq: Two times a day (BID) | ORAL | 0 refills | Status: DC
Start: 2020-08-09 — End: 2022-05-29

## 2020-08-09 MED ORDER — FLEET ENEMA 7-19 GM/118ML RE ENEM: 1.0000 | ENEMA | Freq: Once | RECTAL | Status: DC

## 2020-08-09 MED ORDER — DEXAMETHASONE SODIUM PHOSPHATE 10 MG/ML IJ SOLN
INTRAMUSCULAR | Status: DC | PRN
  Administered 2020-08-09: 5 mg via INTRAVENOUS

## 2020-08-09 MED ORDER — ORAL CARE MOUTH RINSE: 15.0000 mL | Freq: Once | OROMUCOSAL | Status: AC

## 2020-08-09 SURGICAL SUPPLY — 59 items
APPLICATOR COTTON TIP 6 STRL (MISCELLANEOUS) ×2 IMPLANT
APPLICATOR COTTON TIP 6IN STRL (MISCELLANEOUS) ×4
CATH FOLEY 2WAY SLVR 18FR 30CC (CATHETERS) ×4 IMPLANT
CATH ROBINSON RED A/P 16FR (CATHETERS) ×4 IMPLANT
CATH ROBINSON RED A/P 8FR (CATHETERS) ×4 IMPLANT
CATH TIEMANN FOLEY 18FR 5CC (CATHETERS) ×4 IMPLANT
CHLORAPREP W/TINT 26 (MISCELLANEOUS) ×4 IMPLANT
CLIP VESOLOCK LG 6/CT PURPLE (CLIP) ×8 IMPLANT
COVER SURGICAL LIGHT HANDLE (MISCELLANEOUS) ×4 IMPLANT
COVER TIP SHEARS 8 DVNC (MISCELLANEOUS) ×2 IMPLANT
COVER TIP SHEARS 8MM DA VINCI (MISCELLANEOUS) ×2
COVER WAND RF STERILE (DRAPES) IMPLANT
CUTTER ECHEON FLEX ENDO 45 340 (ENDOMECHANICALS) ×4 IMPLANT
DECANTER SPIKE VIAL GLASS SM (MISCELLANEOUS) ×4 IMPLANT
DERMABOND ADVANCED (GAUZE/BANDAGES/DRESSINGS) ×2
DERMABOND ADVANCED .7 DNX12 (GAUZE/BANDAGES/DRESSINGS) ×2 IMPLANT
DRAIN CHANNEL RND F F (WOUND CARE) ×4 IMPLANT
DRAPE ARM DVNC X/XI (DISPOSABLE) ×8 IMPLANT
DRAPE COLUMN DVNC XI (DISPOSABLE) ×2 IMPLANT
DRAPE DA VINCI XI ARM (DISPOSABLE) ×8
DRAPE DA VINCI XI COLUMN (DISPOSABLE) ×2
DRAPE SURG IRRIG POUCH 19X23 (DRAPES) ×4 IMPLANT
DRSG TEGADERM 4X4.75 (GAUZE/BANDAGES/DRESSINGS) ×4 IMPLANT
ELECT REM PT RETURN 15FT ADLT (MISCELLANEOUS) ×4 IMPLANT
GLOVE BIO SURGEON STRL SZ 6.5 (GLOVE) ×3 IMPLANT
GLOVE BIO SURGEONS STRL SZ 6.5 (GLOVE) ×1
GLOVE BIOGEL M STRL SZ7.5 (GLOVE) ×8 IMPLANT
GOWN STRL REUS W/TWL LRG LVL3 (GOWN DISPOSABLE) ×12 IMPLANT
HOLDER FOLEY CATH W/STRAP (MISCELLANEOUS) ×4 IMPLANT
IRRIG SUCT STRYKERFLOW 2 WTIP (MISCELLANEOUS) ×4
IRRIGATION SUCT STRKRFLW 2 WTP (MISCELLANEOUS) ×2 IMPLANT
IV LACTATED RINGERS 1000ML (IV SOLUTION) ×4 IMPLANT
KIT TURNOVER KIT A (KITS) IMPLANT
NDL SAFETY ECLIPSE 18X1.5 (NEEDLE) ×2 IMPLANT
NEEDLE HYPO 18GX1.5 SHARP (NEEDLE) ×2
PACK ROBOT UROLOGY CUSTOM (CUSTOM PROCEDURE TRAY) ×4 IMPLANT
PENCIL SMOKE EVACUATOR (MISCELLANEOUS) IMPLANT
SEAL CANN UNIV 5-8 DVNC XI (MISCELLANEOUS) ×8 IMPLANT
SEAL XI 5MM-8MM UNIVERSAL (MISCELLANEOUS) ×8
SET IRRIG Y TYPE TUR BLADDER L (SET/KITS/TRAYS/PACK) ×4 IMPLANT
SET TUBE SMOKE EVAC HIGH FLOW (TUBING) ×4 IMPLANT
SOLUTION ELECTROLUBE (MISCELLANEOUS) ×4 IMPLANT
STAPLE RELOAD 45 GRN (STAPLE) ×2 IMPLANT
STAPLE RELOAD 45MM GREEN (STAPLE) ×2
SUT ETHILON 3 0 PS 1 (SUTURE) ×4 IMPLANT
SUT MNCRL 3 0 RB1 (SUTURE) ×2 IMPLANT
SUT MNCRL 3 0 VIOLET RB1 (SUTURE) ×2 IMPLANT
SUT MNCRL AB 4-0 PS2 18 (SUTURE) ×8 IMPLANT
SUT MONOCRYL 3 0 RB1 (SUTURE) ×4
SUT VIC AB 0 CT1 27 (SUTURE) ×2
SUT VIC AB 0 CT1 27XBRD ANTBC (SUTURE) ×2 IMPLANT
SUT VIC AB 0 UR5 27 (SUTURE) ×4 IMPLANT
SUT VIC AB 2-0 SH 27 (SUTURE) ×2
SUT VIC AB 2-0 SH 27X BRD (SUTURE) ×2 IMPLANT
SUT VICRYL 0 UR6 27IN ABS (SUTURE) ×8 IMPLANT
SYR 27GX1/2 1ML LL SAFETY (SYRINGE) ×4 IMPLANT
TOWEL OR NON WOVEN STRL DISP B (DISPOSABLE) ×4 IMPLANT
TROCAR XCEL NON-BLD 5MMX100MML (ENDOMECHANICALS) IMPLANT
WATER STERILE IRR 1000ML POUR (IV SOLUTION) ×4 IMPLANT

## 2020-08-09 NOTE — Progress Notes (Signed)
Patient ID: Walter Miranda, male   DOB: 1956-04-19, 64 y.o.   MRN: [id number]  Post-op note  Subjective: The patient is doing well.  No complaints.  Objective: Vital signs in last 24 hours: Temp:  [97.7 F (36.5 C)-98.2 F (36.8 C)] 97.7 F (36.5 C) (08/12 1529) Pulse Rate:  [77-81] 78 (08/12 1529) Resp:  [11-18] 18 (08/12 1529) BP: (113-166)/(75-93) 140/80 (08/12 1529) SpO2:  [99 %-100 %] 100 % (08/12 1529) Weight:  [89.1 kg] 89.1 kg (08/12 0618)  Intake/Output from previous day: No intake/output data recorded. Intake/Output this shift: Total I/O In: 1200 [I.V.:1200] Out: 1135 [Urine:1000; Drains:60; Blood:75]  Physical Exam:  General: Alert and oriented. Abdomen: Soft, Nondistended. Incisions: Clean and dry. GU: Urine clear.  Lab Results: Recent Labs    08/09/20 1152  HGB 12.6*  HCT 37.5*    Assessment/Plan: POD#0   1) Continue to monitor, ambulate, IS   Pryor Curia. MD   LOS: 0 days   Dutch Gray 08/09/2020, 5:09 PM

## 2020-08-09 NOTE — Transfer of Care (Signed)
Immediate Anesthesia Transfer of Care Note  Patient: Bracen Schum  Procedure(s) Performed: XI ROBOTIC ASSISTED LAPAROSCOPIC RADICAL PROSTATECTOMY LEVEL 2 (N/A Abdomen) LYMPHADENECTOMY, PELVIC (Bilateral )  Patient Location: PACU  Anesthesia Type:General  Level of Consciousness: awake, alert , oriented and patient cooperative  Airway & Oxygen Therapy: Patient Spontanous Breathing and Patient connected to face mask oxygen  Post-op Assessment: Report given to RN and Post -op Vital signs reviewed and stable  Post vital signs: Reviewed and stable  Last Vitals:  Vitals Value Taken Time  BP 141/76 08/09/20 1123  Temp    Pulse 80 08/09/20 1124  Resp 12 08/09/20 1124  SpO2 100 % 08/09/20 1124  Vitals shown include unvalidated device data.  Last Pain:  Vitals:   08/09/20 0618  TempSrc:   PainSc: 0-No pain      Patients Stated Pain Goal: 4 (25/27/12 9290)  Complications: No complications documented.

## 2020-08-09 NOTE — Interval H&P Note (Signed)
History and Physical Interval Note:  08/09/2020 6:43 AM  Walter Miranda  has presented today for surgery, with the diagnosis of PROSTATE CANCER.  The various methods of treatment have been discussed with the patient and family. After consideration of risks, benefits and other options for treatment, the patient has consented to  Procedure(s): XI ROBOTIC ASSISTED LAPAROSCOPIC RADICAL PROSTATECTOMY LEVEL 2 (N/A) LYMPHADENECTOMY, PELVIC (Bilateral) as a surgical intervention.  The patient's history has been reviewed, patient examined, no change in status, stable for surgery.  I have reviewed the patient's chart and labs.  Questions were answered to the patient's satisfaction.     [employer]

## 2020-08-09 NOTE — Anesthesia Postprocedure Evaluation (Signed)
Anesthesia Post Note  Patient: Walter Miranda  Procedure(s) Performed: XI ROBOTIC ASSISTED LAPAROSCOPIC RADICAL PROSTATECTOMY LEVEL 2 (N/A Abdomen) LYMPHADENECTOMY, PELVIC (Bilateral )     Patient location during evaluation: PACU Anesthesia Type: General Level of consciousness: awake and alert Pain management: pain level controlled Vital Signs Assessment: post-procedure vital signs reviewed and stable Respiratory status: spontaneous breathing, nonlabored ventilation, respiratory function stable and patient connected to nasal cannula oxygen Cardiovascular status: blood pressure returned to baseline and stable Postop Assessment: no apparent nausea or vomiting Anesthetic complications: no   No complications documented.  Last Vitals:  Vitals:   08/09/20 1200 08/09/20 1300  BP: 137/75 113/75  Pulse: 77 80  Resp: 12 11  Temp:    SpO2: 100% 100%    Last Pain:  Vitals:   08/09/20 1300  TempSrc:   PainSc: Asleep                 [name],W. EDMOND

## 2020-08-09 NOTE — Op Note (Signed)
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 Robotic assisted laparoscopic radical prostatectomy (right nerve sparing) 2. Bilateral robotic assisted laparoscopic pelvic lymphadenectomy  Surgeon: Pryor Curia. M.D.  Assistant(s): Debbrah Alar, PA-C   An assistant was required for this surgical procedure.  The duties of the assistant included but were not limited to suctioning, passing suture, camera manipulation, retraction. This procedure would not be able to be performed without an Environmental consultant.   Resident: Dr. Carmie Kanner  Anesthesia: General  Complications: None  EBL: 75 mL  IVF:  1200 mL crystalloid  Specimens: 1. Prostate and seminal vesicles 2. Right pelvic lymph nodes 3. Left pelvic lymph nodes  Disposition of specimens: Pathology  Drains: 1. 20 Fr coude catheter 2. # 19 Blake pelvic drain  Indication: Walter Miranda is a 64 y.o. patient with clinically localized prostate cancer.  After a thorough review of the management options for treatment of prostate cancer, he elected to proceed with surgical therapy and the above procedure(s).  We have discussed the potential benefits and risks of the procedure, side effects of the proposed treatment, the likelihood of the patient achieving the goals of the procedure, and any potential problems that might occur during the procedure or recuperation. Informed consent has been obtained.  Description of procedure:  The patient was taken to the operating room and a general anesthetic was administered. He was given preoperative antibiotics, placed in the dorsal lithotomy position, and prepped and draped in the usual sterile fashion. Next a preoperative timeout was performed. A urethral catheter was placed into the bladder and a site was selected near  the umbilicus for placement of the camera port. This was placed using a standard open Hassan technique which allowed entry into the peritoneal cavity under direct vision and without difficulty. An 8 mm port was placed and a pneumoperitoneum established. The camera was then used to inspect the abdomen and there was no evidence of any intra-abdominal injuries or other abnormalities. The remaining abdominal ports were then placed. 8 mm robotic ports were placed in the right lower quadrant, left lower quadrant, and far left lateral abdominal wall. A 5 mm port was placed in the right upper quadrant and a 12 mm port was placed in the right lateral abdominal wall for laparoscopic assistance. All ports were placed under direct vision without difficulty. The surgical cart was then docked.   Utilizing the cautery scissors, the bladder was reflected posteriorly allowing entry into the space of Retzius and identification of the endopelvic fascia and prostate. The periprostatic fat was then removed from the prostate allowing full exposure of the endopelvic fascia. The endopelvic fascia was then incised from the apex back to the base of the prostate bilaterally and the underlying levator muscle fibers were swept laterally off the prostate thereby isolating the dorsal venous complex. The dorsal vein was then stapled and divided with a 45 mm Flex Echelon stapler. Attention then turned to the bladder neck which was divided anteriorly thereby allowing entry into the bladder and exposure of the urethral catheter. The catheter balloon was deflated and the catheter was brought into the operative field and used to retract the prostate anteriorly. The posterior bladder neck was then examined and was divided allowing further dissection between the bladder and prostate posteriorly until the vasa deferentia and seminal vessels were identified. The vasa deferentia were isolated, divided,  and lifted anteriorly. The seminal vesicles were  dissected down to their tips with care to control the seminal vascular arterial blood supply. These structures were then lifted anteriorly and the space between Denonvillier's fascia and the anterior rectum was developed with a combination of sharp and blunt dissection. This isolated the vascular pedicles of the prostate.  The lateral prostatic fascia on the right side of the prostate was then sharply incised allowing release of the neurovascular bundle. The vascular pedicle of the prostate on the right side was then ligated with Weck clips between the prostate and neurovascular bundle and divided with sharp cold scissor dissection resulting in neurovascular bundle preservation. On the left side, a wide non nerve sparing dissection was performed with Weck clips used to ligate the vascular pedicle of the prostate. The neurovascular bundle on the right side was then separated off the apex of the prostate and urethra.  The urethra was then sharply transected allowing the prostate specimen to be disarticulated. The pelvis was copiously irrigated and hemostasis was ensured. There was no evidence for rectal injury.  Attention then turned to the right pelvic sidewall. The fibrofatty tissue between the external iliac vein, confluence of the iliac vessels, hypogastric artery, and Cooper's ligament was dissected free from the pelvic sidewall with care to preserve the obturator nerve. Weck clips were used for lymphostasis and hemostasis. An identical procedure was performed on the contralateral side and the lymphatic packets were removed for permanent pathologic analysis.  Attention then turned to the urethral anastomosis. A 2-0 Vicryl slip knot was placed between Denonvillier's fascia, the posterior bladder neck, and the posterior urethra to reapproximate these structures. A double-armed 3-0 Monocryl suture was then used to perform a 360 running tension-free anastomosis between the bladder neck and urethra. A new  urethral catheter was then placed into the bladder and irrigated. There were no blood clots within the bladder and the anastomosis appeared to be watertight. A #19 Blake drain was then brought through the left lateral 8 mm port site and positioned appropriately within the pelvis. It was secured to the skin with a nylon suture. The surgical cart was then undocked. The right lateral 12 mm port site was closed at the fascial level with a 0 Vicryl suture placed laparoscopically. All remaining ports were then removed under direct vision. The prostate specimen was removed intact within the Endopouch retrieval bag via the periumbilical camera port site. This fascial opening was closed with two running 0 Vicryl sutures. 0.25% Marcaine was then injected into all port sites and all incisions were reapproximated at the skin level with 4-0 Monocryl subcuticular sutures. Dermabond was applied. The patient appeared to tolerate the procedure well and without complications. The patient was able to be extubated and transferred to the recovery unit in satisfactory condition.   Pryor Curia MD

## 2020-08-09 NOTE — Anesthesia Procedure Notes (Signed)
Procedure Name: Intubation Date/Time: 08/09/2020 7:25 AM Performed by: Raenette Rover, CRNA Pre-anesthesia Checklist: Patient identified, Emergency Drugs available, Suction available and Patient being monitored Patient Re-evaluated:Patient Re-evaluated prior to induction Oxygen Delivery Method: Circle system utilized Preoxygenation: Pre-oxygenation with 100% oxygen Induction Type: IV induction Ventilation: Mask ventilation without difficulty and Oral airway inserted - appropriate to patient size Laryngoscope Size: Mac and 4 Grade View: Grade I Tube type: Oral Tube size: 7.0 mm Number of attempts: 1 Airway Equipment and Method: Stylet Placement Confirmation: ETT inserted through vocal cords under direct vision,  positive ETCO2 and breath sounds checked- equal and bilateral Secured at: 22 cm Tube secured with: Tape Dental Injury: Teeth and Oropharynx as per pre-operative assessment

## 2020-08-09 NOTE — Discharge Instructions (Signed)
1. Activity:  You are encouraged to ambulate frequently (about every hour during waking hours) to help prevent blood clots from forming in your legs or lungs.  However, you should not engage in any heavy lifting (> 10-15 lbs), strenuous activity, or straining. 2. Diet: You should continue a clear liquid diet until passing gas from below.  Once this occurs, you may advance your diet to a soft diet that would be easy to digest (i.e soups, scrambled eggs, mashed potatoes, etc.) for 24 hours just as you would if getting over a bad stomach flu.  If tolerating this diet well for 24 hours, you may then begin eating regular food.  It will be normal to have some amount of bloating, nausea, and abdominal discomfort intermittently. 3. Prescriptions:  You will be provided a prescription for pain medication to take as needed.  If your pain is not severe enough to require the prescription pain medication, you may take Tylenol instead.  You should also take an over the counter stool softener (Colace 100 mg twice daily) to avoid straining with bowel movements as the pain medication may constipate you. Finally, you will also be provided a prescription for an antibiotic to begin the day prior to your return visit in the office for catheter removal. 4. Catheter care: You will be taught how to take care of the catheter by the nursing staff prior to discharge from the hospital.  You may use both a leg bag and the larger bedside bag but it is recommended to at least use the bigger bedside bag at nighttime as the leg bag is small and will fill up overnight and also does not drain as well when lying flat. You may periodically feel a strong urge to void with the catheter in place.  This is a bladder spasm and most often can occur when having a bowel movement or when you are moving around. It is typically self-limited and usually will stop after a few minutes.  You may use some Vaseline or Neosporin around the tip of the catheter to  reduce friction at the tip of the penis. 5. Incisions: You may remove your dressing bandages the 2nd day after surgery.  You most likely will have a few small staples in each of the incisions and once the bandages are removed, the incisions may stay open to air.  You may start showering (not soaking or bathing in water) 48 hours after surgery and the incisions simply need to be patted dry after the shower.  No additional care is needed. 6. What to call us about: You should call the office [phone]) if you develop fever > 101, persistent vomiting, or the catheter stops draining. Also, feel free to call with any other questions you may have and remember the handout that was provided to you as a reference preoperatively which answers many of the common questions that arise after surgery. 7. You may resume aspirin, advil, aleve, vitamins, and supplements 7 days after surgery.  8. You may resume Enbrel 2 weeks after surgery

## 2020-08-10 ENCOUNTER — Encounter (HOSPITAL_COMMUNITY): Payer: Self-pay | Admitting: Urology

## 2020-08-10 DIAGNOSIS — C61 Malignant neoplasm of prostate: Secondary | ICD-10-CM | POA: Diagnosis not present

## 2020-08-10 LAB — HEMOGLOBIN AND HEMATOCRIT, BLOOD
HCT: 37.2 % — ABNORMAL LOW (ref 39.0–52.0)
Hemoglobin: 12.5 g/dL — ABNORMAL LOW (ref 13.0–17.0)

## 2020-08-10 MED ORDER — BISACODYL 10 MG RE SUPP
10.0000 mg | Freq: Once | RECTAL | Status: AC
  Administered 2020-08-10: 10 mg via RECTAL
  Filled 2020-08-10: qty 1

## 2020-08-10 MED ORDER — TRAMADOL HCL 50 MG PO TABS
50.0000 mg | ORAL_TABLET | Freq: Four times a day (QID) | ORAL | Status: DC | PRN
  Administered 2020-08-10: 50 mg via ORAL
  Administered 2020-08-10: 100 mg via ORAL
  Filled 2020-08-10: qty 2
  Filled 2020-08-10: qty 1

## 2020-08-10 NOTE — Discharge Summary (Signed)
Date of admission: 08/09/2020  Date of discharge: 08/10/2020  Admission diagnosis: Prostate Cancer  Discharge diagnosis: Prostate Cancer  History and Physical: For full details, please see admission history and physical. Briefly, Walter Miranda is a 63 y.o. gentleman with localized prostate cancer.  After discussing management/treatment options, he elected to proceed with surgical treatment.  Hospital Course: Walter Miranda was taken to the operating room on 08/09/2020 and underwent a robotic assisted laparoscopic radical prostatectomy. He tolerated this procedure well and without complications. Postoperatively, he was able to be transferred to a regular hospital room following recovery from anesthesia.  He was able to begin ambulating the night of surgery. He remained hemodynamically stable overnight.  He had excellent urine output with appropriately minimal output from his pelvic drain and his pelvic drain was removed on POD #1.  He was transitioned to oral pain medication, tolerated a clear liquid diet, and had met all discharge criteria and was able to be discharged home later on POD#1.  Laboratory values:  Recent Labs    08/09/20 1152 08/10/20 0514  HGB 12.6* 12.5*  HCT 37.5* 37.2*    Disposition: Home  Discharge instruction: He was instructed to be ambulatory but to refrain from heavy lifting, strenuous activity, or driving. He was instructed on urethral catheter care.  Discharge medications:   Allergies as of 08/10/2020   No Known Allergies     Medication List    STOP taking these medications   Enbrel SureClick 50 MG/ML injection Generic drug: etanercept   naproxen sodium 220 MG tablet Commonly known as: ALEVE     TAKE these medications   DULoxetine 60 MG capsule Commonly known as: CYMBALTA Take 60 mg by mouth daily.   sulfamethoxazole-trimethoprim 800-160 MG tablet Commonly known as: BACTRIM DS Take 1 tablet by mouth 2 (two) times daily. Start the day prior to  foley removal appointment   traMADol 50 MG tablet Commonly known as: Ultram Take 1-2 tablets (50-100 mg total) by mouth every 6 (six) hours as needed for moderate pain or severe pain.       Followup: He will followup in 1 week for catheter removal and to discuss his surgical pathology results.  

## 2020-08-10 NOTE — Progress Notes (Signed)
Patient ID: Walter Miranda, male   DOB: 06-09-1956, 64 y.o.   MRN: [id number]  1 Day Post-Op Subjective: The patient is doing well.  No nausea or vomiting. Pain is adequately controlled.  Objective: Vital signs in last 24 hours: Temp:  [97.7 F (36.5 C)-98.8 F (37.1 C)] 98.4 F (36.9 C) (08/13 0600) Pulse Rate:  [57-81] 57 (08/13 0600) Resp:  [11-18] 16 (08/13 0600) BP: (105-141)/(57-89) 108/79 (08/13 0600) SpO2:  [99 %-100 %] 100 % (08/13 0600)  Intake/Output from previous day: 08/12 0701 - 08/13 0700 In: 3235.1 [P.O.:860; I.V.:2275.1; IV Piggyback:100] Out: 3600 [Urine:3400; Drains:125; Blood:75] Intake/Output this shift: Total I/O In: 1704.9 [P.O.:740; I.V.:914.9; IV Piggyback:50] Out: 2465 [Urine:2400; Drains:65]  Physical Exam:  General: Alert and oriented. CV: RRR Lungs: Clear bilaterally. GI: Soft, Nondistended. Incisions: Clean, dry, and intact Urine: Clear Extremities: Nontender, no erythema, no edema.  Lab Results: Recent Labs    08/09/20 1152 08/10/20 0514  HGB 12.6* 12.5*  HCT 37.5* 37.2*      Assessment/Plan: POD# 1 s/p robotic prostatectomy.  1) SL IVF 2) Ambulate, Incentive spirometry 3) Transition to oral pain medication 4) Dulcolax suppository 5) D/C pelvic drain 6) Plan for likely discharge later today   Pryor Curia. MD   LOS: 0 days   Dutch Gray 08/10/2020, 6:57 AM

## 2020-08-10 NOTE — Progress Notes (Signed)
Patient discharging home.  IV removed -WNL.  Reviewed AVS and medications with patients and wife.  Follow up in place for 8/20.  Educated patient and wife on catheter management at home - wife able to demonstrate changing bags out successfully.  Encouraged patient to drink plenty of water, to ambulate frequently, and continue use if IS.  Patient and wife verbalize understanding - no questions at this time.  Patient in NAD at time of discharge

## 2020-08-14 LAB — SURGICAL PATHOLOGY

## 2020-11-04 IMAGING — NM NM BONE WHOLE BODY
2 series · 2 of 2 positions shown · non-contrast
Comparison: None

CLINICAL DATA: Prostate cancer.  PSA is 21.4 on 05/23/2020.

EXAM:
NUCLEAR MEDICINE WHOLE BODY BONE SCAN
TECHNIQUE: Whole body anterior and posterior images were obtained approximately
3 hours after intravenous injection of radiopharmaceutical.
RADIOPHARMACEUTICALS:  18.5 mCi Oechnetium-66m MDP IV

[Series 1: wbr_bone_40 whole body · 2.66mm/px · 1 of 1 slices shown (1 of 2)]
[im 1/1]
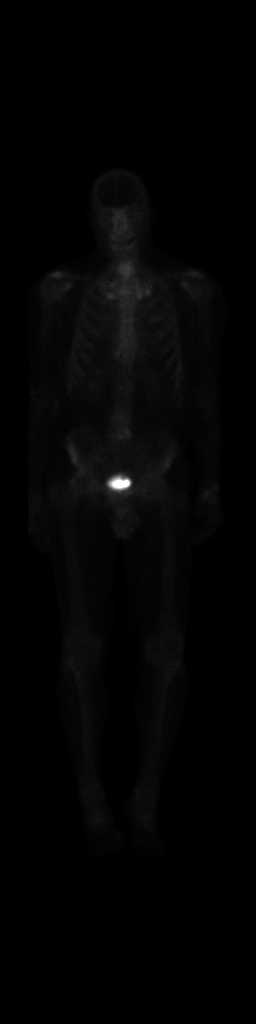

[Series 1: wbr_bone_40 whole body · 2.66mm/px · 1 of 1 slices shown (2 of 2)]
[im 1/1]
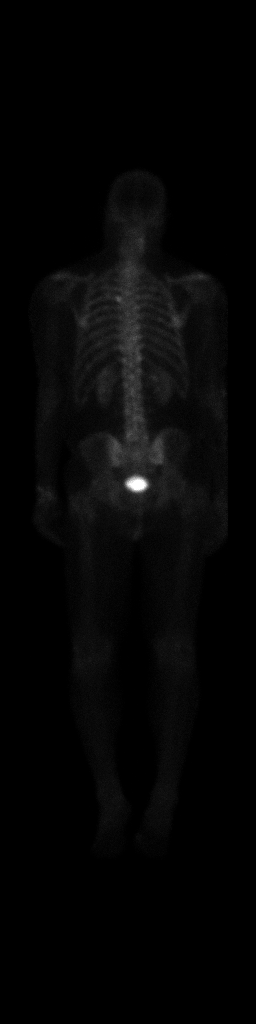

[2 of 2 positions shown; findings below may reference images not displayed]

FINDINGS: Symmetric activity within both kidneys. Activity within the bladder.
Small focus of increased osseous remodeling is identified along the
LEFT posterior 6th rib, at the costovertebral junction. The findings
are possibly related to degenerative changes. Other mildly increased
areas of increased osseous remodeling identified in the shoulders
and LEFT wrist, are likely degenerative.
IMPRESSION: 1. Focal activity in the LEFT posterior 6th costovertebral junction
is favored to represent degenerative changes.
2. No definitive evidence for osseous metastatic disease.

## 2022-02-25 ENCOUNTER — Encounter (INDEPENDENT_AMBULATORY_CARE_PROVIDER_SITE_OTHER): Payer: Self-pay | Admitting: *Deleted

## 2022-03-25 ENCOUNTER — Other Ambulatory Visit (INDEPENDENT_AMBULATORY_CARE_PROVIDER_SITE_OTHER): Payer: Self-pay

## 2022-03-25 ENCOUNTER — Telehealth (INDEPENDENT_AMBULATORY_CARE_PROVIDER_SITE_OTHER): Payer: Self-pay

## 2022-03-25 ENCOUNTER — Encounter (INDEPENDENT_AMBULATORY_CARE_PROVIDER_SITE_OTHER): Payer: Self-pay | Admitting: *Deleted

## 2022-03-25 ENCOUNTER — Encounter (INDEPENDENT_AMBULATORY_CARE_PROVIDER_SITE_OTHER): Payer: Self-pay

## 2022-03-25 MED ORDER — CLENPIQ 10-3.5-12 MG-GM -GM/160ML PO SOLN: 1.0000 | Freq: Once | ORAL | 0 refills | Status: AC

## 2022-03-25 NOTE — Progress Notes (Signed)
Ok to schedule.  Thanks,  Angelette Ganus Castaneda Mayorga, MD Gastroenterology and Hepatology [facility]  

## 2022-03-25 NOTE — Progress Notes (Unsigned)
Referring MD/PCP: Chauncey Reading ? ?Procedure: tcs ? ?Reason/Indication:  screening ? ?Has patient had this procedure before?  no ? If so, when, by whom and where?   ? ?Is there a family history of colon cancer?  no ? Who?  What age when diagnosed?   ? ?Is patient diabetic? If yes, Type 1 or Type 2   no ?     ?Does patient have prosthetic heart valve or mechanical valve?  no ? ?Do you have a pacemaker/defibrillator?  no ? ?Has patient ever had endocarditis/atrial fibrillation? no ? ?Does patient use oxygen? no ? ?Has patient had joint replacement within last 12 months?  no ? ?Is patient constipated or do they take laxatives? no ? ?Does patient have a history of alcohol/drug use?  no ? ?Have you had a stroke/heart attack last 6 mths? no ? ?Do you take medicine for weight loss?  no ? ?For male patients,: have you had a hysterectomy N/A ?                     are you post menopausal N/A ?                     do you still have your menstrual cycle N/A ? ?Is patient on blood thinner such as Coumadin, Plavix and/or Aspirin? no ? ?Medications: Enbrel 50 mg once a week, Cymbalta 60 mg daily ? ?Allergies: nkda ? ?Medication Adjustment per Dr Jenetta Downer none ? ?Procedure date & time: 05/07/22 at 8:20  ? ? ?

## 2022-03-25 NOTE — Telephone Encounter (Signed)
Drisana Schweickert Ann Shalinda Burkholder, CMA  ?

## 2022-05-02 ENCOUNTER — Encounter (INDEPENDENT_AMBULATORY_CARE_PROVIDER_SITE_OTHER): Payer: Self-pay

## 2022-06-04 ENCOUNTER — Encounter (HOSPITAL_COMMUNITY): Payer: Self-pay | Admitting: Gastroenterology

## 2022-06-04 ENCOUNTER — Ambulatory Visit (HOSPITAL_COMMUNITY)
Admission: RE | Admit: 2022-06-04 | Discharge: 2022-06-04 | Disposition: A | Discharge status: Home / Self Care | Payer: BC Managed Care – PPO | Attending: Gastroenterology | Admitting: Gastroenterology

## 2022-06-04 ENCOUNTER — Encounter (HOSPITAL_COMMUNITY)
Admission: RE | Disposition: A | Discharge status: Home / Self Care | Payer: Self-pay | Source: Home / Self Care | Attending: Gastroenterology

## 2022-06-04 ENCOUNTER — Other Ambulatory Visit: Payer: Self-pay

## 2022-06-04 ENCOUNTER — Ambulatory Visit (HOSPITAL_COMMUNITY): Payer: BC Managed Care – PPO | Admitting: Anesthesiology

## 2022-06-04 ENCOUNTER — Encounter (INDEPENDENT_AMBULATORY_CARE_PROVIDER_SITE_OTHER): Payer: Self-pay | Admitting: *Deleted

## 2022-06-04 DIAGNOSIS — Z1211 Encounter for screening for malignant neoplasm of colon: Secondary | ICD-10-CM | POA: Insufficient documentation

## 2022-06-04 DIAGNOSIS — K635 Polyp of colon: Secondary | ICD-10-CM | POA: Insufficient documentation

## 2022-06-04 DIAGNOSIS — F419 Anxiety disorder, unspecified: Secondary | ICD-10-CM | POA: Insufficient documentation

## 2022-06-04 DIAGNOSIS — K5730 Diverticulosis of large intestine without perforation or abscess without bleeding: Secondary | ICD-10-CM | POA: Insufficient documentation

## 2022-06-04 DIAGNOSIS — Z8546 Personal history of malignant neoplasm of prostate: Secondary | ICD-10-CM | POA: Diagnosis not present

## 2022-06-04 HISTORY — PX: COLONOSCOPY WITH PROPOFOL: SHX5780

## 2022-06-04 HISTORY — PX: POLYPECTOMY: SHX5525

## 2022-06-04 LAB — HM COLONOSCOPY

## 2022-06-04 SURGERY — COLONOSCOPY WITH PROPOFOL
Anesthesia: General

## 2022-06-04 MED ORDER — DEXMEDETOMIDINE (PRECEDEX) IN NS 20 MCG/5ML (4 MCG/ML) IV SYRINGE
PREFILLED_SYRINGE | INTRAVENOUS | Status: DC | PRN
  Administered 2022-06-04: 12 ug via INTRAVENOUS

## 2022-06-04 MED ORDER — PROPOFOL 10 MG/ML IV BOLUS
INTRAVENOUS | Status: DC | PRN
  Administered 2022-06-04: 100 mg via INTRAVENOUS

## 2022-06-04 MED ORDER — SODIUM CHLORIDE FLUSH 0.9 % IV SOLN
INTRAVENOUS | Status: AC
  Filled 2022-06-04: qty 10

## 2022-06-04 MED ORDER — DEXMEDETOMIDINE HCL IN NACL 80 MCG/20ML IV SOLN
INTRAVENOUS | Status: AC
  Filled 2022-06-04: qty 20

## 2022-06-04 MED ORDER — PROPOFOL 500 MG/50ML IV EMUL
INTRAVENOUS | Status: DC | PRN
  Administered 2022-06-04: 150 ug/kg/min via INTRAVENOUS

## 2022-06-04 MED ORDER — LACTATED RINGERS IV SOLN: INTRAVENOUS | Status: DC

## 2022-06-04 MED ORDER — PROPOFOL 500 MG/50ML IV EMUL
INTRAVENOUS | Status: AC
  Filled 2022-06-04: qty 50

## 2022-06-04 NOTE — H&P (Signed)
Walter Miranda is an 66 y.o. male.   Chief Complaint: Colorectal cancer screening, HPI: 66 year old male with past medical history of anxiety, prostate cancer and arthritis, coming for screening colonoscopy. The patient has never had a colonoscopy in the past.  The patient denies having any complaints such as melena, hematochezia, abdominal pain or distention, change in her bowel movement consistency or frequency, no changes in her weight recently.  No family history of colorectal cancer.   Past Medical History:  Diagnosis Date   Anxiety    Arthritis    Cancer [facility])    prostate    Past Surgical History:  Procedure Laterality Date   CHOLECYSTECTOMY     galbladder surgery     left wrist surgery      arthritic fused  the bones plate placed    LYMPHADENECTOMY Bilateral 08/09/2020   Procedure: LYMPHADENECTOMY, PELVIC;  Surgeon: Raynelle Bring, MD;  Location: [facility]S;  Service: Urology;  Laterality: Bilateral;   ROBOT ASSISTED LAPAROSCOPIC RADICAL PROSTATECTOMY N/A 08/09/2020   Procedure: XI ROBOTIC ASSISTED LAPAROSCOPIC RADICAL PROSTATECTOMY LEVEL 2;  Surgeon: Raynelle Bring, MD;  Location: [facility];  Service: Urology;  Laterality: N/A;    Family History  Problem Relation Age of Onset   Colon cancer Neg Hx    Social History:  reports that he has never smoked. He has never used smokeless tobacco. He reports current alcohol use of about 6.0 standard drinks per week. He reports that he does not use drugs.  Allergies: No Known Allergies  Medications Prior to Admission  Medication Sig Dispense Refill   Cyanocobalamin (VITAMIN B-12 PO) Take 1 tablet by mouth daily.     DULoxetine (CYMBALTA) 60 MG capsule Take 60 mg by mouth daily.     ENBREL SURECLICK 50 MG/ML injection Inject 50 mg into the skin once a week.     valACYclovir (VALTREX) 500 MG tablet Take 500 mg by mouth daily.      No results found for this or any previous visit (from the past 48 hour(s)). No results found.  Review of  Systems  Constitutional: Negative.   HENT: Negative.    Eyes: Negative.   Respiratory: Negative.    Cardiovascular: Negative.   Gastrointestinal: Negative.   Endocrine: Negative.   Genitourinary: Negative.   Musculoskeletal: Negative.   Skin: Negative.   Allergic/Immunologic: Negative.   Neurological: Negative.   Hematological: Negative.   Psychiatric/Behavioral: Negative.     Blood pressure (!) 152/85, pulse (!) 50, temperature 98.3 F (36.8 C), temperature source Oral, resp. rate 12, height 6' (1.829 m), weight 88.5 kg, SpO2 100 %. Physical Exam  GENERAL: The patient is AO x3, in no acute distress. HEENT: Head is normocephalic and atraumatic. EOMI are intact. Mouth is well hydrated and without lesions. NECK: Supple. No masses LUNGS: Clear to auscultation. No presence of rhonchi/wheezing/rales. Adequate chest expansion HEART: RRR, normal s1 and s2. ABDOMEN: Soft, nontender, no guarding, no peritoneal signs, and nondistended. BS +. No masses. EXTREMITIES: Without any cyanosis, clubbing, rash, lesions or edema. NEUROLOGIC: AOx3, no focal motor deficit. SKIN: no jaundice, no rashes'  Assessment/Plan 66 year old male with past medical history of anxiety, prostate cancer and arthritis, coming for screening colonoscopy. The patient is at average risk for colorectal cancer.  We will proceed with colonoscopy today.   Harvel Quale, MD 06/04/2022, 9:48 AM

## 2022-06-04 NOTE — Anesthesia Preprocedure Evaluation (Signed)
Anesthesia Evaluation  Patient identified by MRN, date of birth, ID band Patient awake    Reviewed: Allergy & Precautions, H&P , NPO status , Patient's Chart, lab work & pertinent test results, reviewed documented beta blocker date and time   Airway Mallampati: II  TM Distance: >3 FB Neck ROM: full    Dental no notable dental hx.    Pulmonary neg pulmonary ROS,    Pulmonary exam normal breath sounds clear to auscultation       Cardiovascular Exercise Tolerance: Good negative cardio ROS   Rhythm:regular Rate:Normal     Neuro/Psych PSYCHIATRIC DISORDERS Anxiety negative neurological ROS     GI/Hepatic negative GI ROS, Neg liver ROS,   Endo/Other  negative endocrine ROS  Renal/GU negative Renal ROS  negative genitourinary   Musculoskeletal   Abdominal   Peds  Hematology negative hematology ROS (+)   Anesthesia Other Findings   Reproductive/Obstetrics negative OB ROS                             Anesthesia Physical Anesthesia Plan  ASA: 2  Anesthesia Plan: General   Post-op Pain Management:    Induction:   PONV Risk Score and Plan: Propofol infusion  Airway Management Planned:   Additional Equipment:   Intra-op Plan:   Post-operative Plan:   Informed Consent: I have reviewed the patients History and Physical, chart, labs and discussed the procedure including the risks, benefits and alternatives for the proposed anesthesia with the patient or authorized representative who has indicated his/her understanding and acceptance.     Dental Advisory Given  Plan Discussed with: CRNA  Anesthesia Plan Comments:         Anesthesia Quick Evaluation  

## 2022-06-04 NOTE — Op Note (Addendum)
[facility] Patient Name: Walter Miranda Procedure Date: 06/04/2022 10:53 AM MRN: [id number] Date of Birth: 21-Jan-1956 Attending MD: Maylon Peppers ,  CSN: [id number] Age: 66 Admit Type: Outpatient Procedure:                Colonoscopy Indications:              Screening for colorectal malignant neoplasm Providers:                Maylon Peppers, Lurline Del, RN, Raphael Gibney,                            Technician Referring MD:              Medicines:                Monitored Anesthesia Care Complications:            No immediate complications. Estimated Blood Loss:     Estimated blood loss: none. Procedure:                Pre-Anesthesia Assessment:                           - Prior to the procedure, a History and Physical                            was performed, and patient medications, allergies                            and sensitivities were reviewed. The patient's                            tolerance of previous anesthesia was reviewed.                           - The risks and benefits of the procedure and the                            sedation options and risks were discussed with the                            patient. All questions were answered and informed                            consent was obtained.                           - ASA Grade Assessment: II - A patient with mild                            systemic disease.                           After obtaining informed consent, the colonoscope                            was passed under direct vision. Throughout the  procedure, the patient's blood pressure, pulse, and                            oxygen saturations were monitored continuously. The                            PCF-HQ190L ([id number]) scope was introduced through                            the anus and advanced to the the cecum, identified                            by appendiceal orifice and ileocecal valve. The                             colonoscopy was performed without difficulty. The                            patient tolerated the procedure well. The quality                            of the bowel preparation was good. Scope In: 11:06:33 AM Scope Out: 11:38:05 AM Scope Withdrawal Time: 0 hours 29 minutes 10 seconds  Total Procedure Duration: 0 hours 31 minutes 32 seconds  Findings:      The perianal and digital rectal examinations were normal.      A 15 mm polyp was found in the cecum. The polyp was semi-sessile. Area       was successfully injected with 3 mL Eleview for a lift polypectomy.       Imaging was performed using white light and narrow band imaging to       visualize the mucosa and demarcate the polyp site after injection for       EMR purposes. The polyp was removed with a piecemeal technique int wo       pieces using a hot snare. Resection and retrieval were complete. To       prevent bleeding after the polypectomy, two hemostatic clips were       successfully placed. There was no bleeding at the end of the procedure.      Four sessile polyps were found in the transverse colon, ascending colon       and cecum. The polyps were 2 to 6 mm in size. These polyps were removed       with a cold snare. Resection and retrieval were complete.      A 4 mm polyp was found in the descending colon. The polyp was sessile.       The polyp was removed with a cold snare. Resection was complete, but the       polyp tissue was not retrieved.      A few small-mouthed diverticula were found in the sigmoid colon.      The retroflexed view of the distal rectum and anal verge was normal and       showed no anal or rectal abnormalities. Impression:               - One 15 mm polyp in the cecum, removed piecemeal  using a hot snare. Resected and retrieved.                            Injected. Clips were placed.                           - Four 2 to 6 mm polyps in the transverse colon, in                             the ascending colon and in the cecum, removed with                            a cold snare. Resected and retrieved.                           - One 4 mm polyp in the descending colon, removed                            with a cold snare. Complete resection. Polyp tissue                            not retrieved.                           - Diverticulosis in the sigmoid colon.                           - The distal rectum and anal verge are normal on                            retroflexion view. Moderate Sedation:      Per Anesthesia Care Recommendation:           - Discharge patient to home (ambulatory).                           - Resume previous diet.                           - Await pathology results.                           - Repeat colonoscopy in 6 months for surveillance                            after piecemeal polypectomy. Procedure Code(s):        --- Professional ---                           [id number], 59, Colonoscopy, flexible; with removal of                            tumor(s), polyp(s), or other lesion(s) by snare  technique                           [id number], Colonoscopy, flexible; with directed                            submucosal injection(s), any substance Diagnosis Code(s):        --- Professional ---                           K63.5, Polyp of colon                           Z12.11, Encounter for screening for malignant                            neoplasm of colon                           K57.30, Diverticulosis of large intestine without                            perforation or abscess without bleeding CPT copyright 2019 American Medical Association. All rights reserved. The codes documented in this report are preliminary and upon coder review may  be revised to meet current compliance requirements. Maylon Peppers, MD Maylon Peppers,  06/04/2022 11:43:10 AM This report has been signed electronically. Number of  Addenda: 0

## 2022-06-04 NOTE — Discharge Instructions (Addendum)
You are being discharged to home.  Resume your previous diet.  We are waiting for your pathology results.  Your physician has recommended a repeat colonoscopy in six months for surveillance after today's piecemeal polypectomy.   OFFICE notified Walter Miranda of need for future colonoscopy via telephone

## 2022-06-04 NOTE — Transfer of Care (Signed)
Immediate Anesthesia Transfer of Care Note  Patient: Walter Miranda  Procedure(s) Performed: COLONOSCOPY WITH PROPOFOL POLYPECTOMY  Patient Location: PACU  Anesthesia Type:General  Level of Consciousness: awake, alert  and oriented  Airway & Oxygen Therapy: Patient Spontanous Breathing  Post-op Assessment: Report given to RN, Post -op Vital signs reviewed and stable, Patient moving all extremities X 4 and Patient able to stick tongue midline  Post vital signs: Reviewed  Last Vitals:  Vitals Value Taken Time  BP 108/79   Temp 97.5   Pulse 55   Resp 23   SpO2 98     Last Pain:  Vitals:   06/04/22 1104  TempSrc:   PainSc: 0-No pain      Patients Stated Pain Goal: 7 (33/35/45 6256)  Complications: No notable events documented.

## 2022-06-05 LAB — SURGICAL PATHOLOGY

## 2022-06-05 NOTE — Anesthesia Postprocedure Evaluation (Signed)
Anesthesia Post Note  Patient: Walter Miranda  Procedure(s) Performed: COLONOSCOPY WITH PROPOFOL POLYPECTOMY  Patient location during evaluation: Phase II Anesthesia Type: General Level of consciousness: awake Pain management: pain level controlled Vital Signs Assessment: post-procedure vital signs reviewed and stable Respiratory status: spontaneous breathing and respiratory function stable Cardiovascular status: blood pressure returned to baseline and stable Postop Assessment: no headache and no apparent nausea or vomiting Anesthetic complications: no Comments: Late entry   No notable events documented.   Last Vitals:  Vitals:   06/04/22 1145 06/04/22 1147  BP: 108/79 (!) 144/86  Pulse: (!) 51 (!) 54  Resp: 16 18  Temp: (!) 36.4 C   SpO2: 99% 100%    Last Pain:  Vitals:   06/04/22 1147  TempSrc:   PainSc: 0-No pain                 Louann Sjogren

## 2022-06-11 ENCOUNTER — Encounter (HOSPITAL_COMMUNITY): Payer: Self-pay | Admitting: Gastroenterology

## 2022-11-17 ENCOUNTER — Encounter (INDEPENDENT_AMBULATORY_CARE_PROVIDER_SITE_OTHER): Payer: Self-pay | Admitting: *Deleted

## 2023-04-22 ENCOUNTER — Encounter (INDEPENDENT_AMBULATORY_CARE_PROVIDER_SITE_OTHER): Payer: Self-pay | Admitting: *Deleted

## 2023-05-07 ENCOUNTER — Telehealth (INDEPENDENT_AMBULATORY_CARE_PROVIDER_SITE_OTHER): Payer: Self-pay | Admitting: Gastroenterology

## 2023-05-07 NOTE — Telephone Encounter (Signed)
Any room Thanks 

## 2023-05-07 NOTE — Telephone Encounter (Signed)
Who is your primary care physician: Dr.Mark Leary Roca  Reasons for the colonoscopy: Recall  Have you had a colonoscopy before?  Yes June 04 2022  Do you have family history of colon cancer? no  Previous colonoscopy with polyps removed? Yes 06/04/22  Do you have a history colorectal cancer?   no  Are you diabetic? If yes, Type 1 or Type 2?    no  Do you have a prosthetic or mechanical heart valve? no  Do you have a pacemaker/defibrillator?   no  Have you had endocarditis/atrial fibrillation? no  Have you had joint replacement within the last 12 months?  no  Do you tend to be constipated or have to use laxatives? no  Do you have any history of drugs or alchohol?  no  Do you use supplemental oxygen?  no  Have you had a stroke or heart attack within the last 6 months? no  Do you take weight loss medication? no  Do you take any blood-thinning medications such as: (aspirin, warfarin, Plavix, Aggrenox)  no  If yes we need the name, milligram, dosage and who is prescribing doctor  Current Outpatient Medications on File Prior to Visit  Medication Sig Dispense Refill   Cyanocobalamin (VITAMIN B-12 PO) Take 1 tablet by mouth daily.     DULoxetine (CYMBALTA) 60 MG capsule Take 60 mg by mouth daily.     ENBREL SURECLICK 50 MG/ML injection Inject 50 mg into the skin once a week.     valACYclovir (VALTREX) 500 MG tablet Take 500 mg by mouth daily.     No current facility-administered medications on file prior to visit.    No Known Allergies   Ph[facility]acy: [facility] [employer] Name: Medicare; [employer]  Best number where you can be reached: [phone]

## 2023-05-08 MED ORDER — PEG 3350-KCL-NA BICARB-NACL 420 G PO SOLR: 4000.0000 mL | Freq: Once | ORAL | 0 refills | Status: AC

## 2023-05-08 NOTE — Addendum Note (Signed)
Addended by: Marlowe Shores on: 05/08/2023 10:17 AM   Modules accepted: Orders

## 2023-05-08 NOTE — Telephone Encounter (Signed)
Pt contacted. Spoke with pt wife Walter Miranda. TCS scheduled for 06/24/23 at 10 am. Prep sent to pharamacy ([facility] states [facility] [place] but nothing in [place] came up when searched, prep sent to [facility] [place]). Instructions will be mailed to patient.

## 2023-05-13 ENCOUNTER — Encounter (INDEPENDENT_AMBULATORY_CARE_PROVIDER_SITE_OTHER): Payer: Self-pay | Admitting: *Deleted

## 2023-05-13 NOTE — Telephone Encounter (Signed)
Referral completed

## 2023-06-24 ENCOUNTER — Ambulatory Visit (HOSPITAL_COMMUNITY): Payer: Medicare Other | Admitting: Certified Registered Nurse Anesthetist

## 2023-06-24 ENCOUNTER — Encounter (HOSPITAL_COMMUNITY): Payer: Self-pay | Admitting: Gastroenterology

## 2023-06-24 ENCOUNTER — Other Ambulatory Visit: Payer: Self-pay

## 2023-06-24 ENCOUNTER — Ambulatory Visit (HOSPITAL_COMMUNITY)
Admission: RE | Admit: 2023-06-24 | Discharge: 2023-06-24 | Disposition: A | Discharge status: Home / Self Care | Payer: Medicare Other | Attending: Gastroenterology | Admitting: Gastroenterology

## 2023-06-24 ENCOUNTER — Ambulatory Visit (HOSPITAL_BASED_OUTPATIENT_CLINIC_OR_DEPARTMENT_OTHER): Payer: Medicare Other | Admitting: Certified Registered Nurse Anesthetist

## 2023-06-24 ENCOUNTER — Encounter (HOSPITAL_COMMUNITY)
Admission: RE | Disposition: A | Discharge status: Home / Self Care | Payer: Self-pay | Source: Home / Self Care | Attending: Gastroenterology

## 2023-06-24 DIAGNOSIS — D124 Benign neoplasm of descending colon: Secondary | ICD-10-CM | POA: Insufficient documentation

## 2023-06-24 DIAGNOSIS — Z09 Encounter for follow-up examination after completed treatment for conditions other than malignant neoplasm: Secondary | ICD-10-CM | POA: Diagnosis present

## 2023-06-24 DIAGNOSIS — F419 Anxiety disorder, unspecified: Secondary | ICD-10-CM | POA: Insufficient documentation

## 2023-06-24 DIAGNOSIS — Z860100 Personal history of colon polyps, unspecified: Secondary | ICD-10-CM

## 2023-06-24 DIAGNOSIS — D123 Benign neoplasm of transverse colon: Secondary | ICD-10-CM | POA: Insufficient documentation

## 2023-06-24 DIAGNOSIS — Z8546 Personal history of malignant neoplasm of prostate: Secondary | ICD-10-CM | POA: Diagnosis not present

## 2023-06-24 DIAGNOSIS — K649 Unspecified hemorrhoids: Secondary | ICD-10-CM | POA: Diagnosis not present

## 2023-06-24 DIAGNOSIS — K5790 Diverticulosis of intestine, part unspecified, without perforation or abscess without bleeding: Secondary | ICD-10-CM | POA: Diagnosis not present

## 2023-06-24 DIAGNOSIS — K5730 Diverticulosis of large intestine without perforation or abscess without bleeding: Secondary | ICD-10-CM | POA: Diagnosis not present

## 2023-06-24 DIAGNOSIS — D120 Benign neoplasm of cecum: Secondary | ICD-10-CM | POA: Insufficient documentation

## 2023-06-24 DIAGNOSIS — K5520 Angiodysplasia of colon without hemorrhage: Secondary | ICD-10-CM | POA: Diagnosis not present

## 2023-06-24 DIAGNOSIS — K648 Other hemorrhoids: Secondary | ICD-10-CM | POA: Insufficient documentation

## 2023-06-24 DIAGNOSIS — F1290 Cannabis use, unspecified, uncomplicated: Secondary | ICD-10-CM | POA: Diagnosis not present

## 2023-06-24 DIAGNOSIS — Z79899 Other long term (current) drug therapy: Secondary | ICD-10-CM | POA: Diagnosis not present

## 2023-06-24 DIAGNOSIS — M1990 Unspecified osteoarthritis, unspecified site: Secondary | ICD-10-CM | POA: Insufficient documentation

## 2023-06-24 DIAGNOSIS — K635 Polyp of colon: Secondary | ICD-10-CM

## 2023-06-24 HISTORY — PX: POLYPECTOMY: SHX5525

## 2023-06-24 HISTORY — PX: BIOPSY: SHX5522

## 2023-06-24 HISTORY — PX: COLONOSCOPY WITH PROPOFOL: SHX5780

## 2023-06-24 SURGERY — COLONOSCOPY WITH PROPOFOL
Anesthesia: General

## 2023-06-24 MED ORDER — PROPOFOL 10 MG/ML IV BOLUS
INTRAVENOUS | Status: DC | PRN
  Administered 2023-06-24: 100 mg via INTRAVENOUS
  Administered 2023-06-24: 50 mg via INTRAVENOUS

## 2023-06-24 MED ORDER — STERILE WATER FOR IRRIGATION IR SOLN
Status: DC | PRN
  Administered 2023-06-24: 240 mL

## 2023-06-24 MED ORDER — LACTATED RINGERS IV SOLN: INTRAVENOUS | Status: DC

## 2023-06-24 MED ORDER — LACTATED RINGERS IV SOLN
INTRAVENOUS | Status: DC
  Administered 2023-06-24: 1000 mL via INTRAVENOUS

## 2023-06-24 MED ORDER — PROPOFOL 500 MG/50ML IV EMUL
INTRAVENOUS | Status: AC
  Filled 2023-06-24: qty 50

## 2023-06-24 MED ORDER — PROPOFOL 500 MG/50ML IV EMUL
INTRAVENOUS | Status: DC | PRN
  Administered 2023-06-24: 200 ug/kg/min via INTRAVENOUS

## 2023-06-24 NOTE — Op Note (Signed)
[facility] Patient Name: Walter Miranda Procedure Date: 06/24/2023 10:20 AM MRN: [id number] Date of Birth: January 10, 1956 Attending MD: Katrinka Blazing , , [id number] CSN: [id number] Age: 67 Admit Type: Outpatient Procedure:                Colonoscopy Indications:              Surveillance: Personal history of piecemeal removal                            of adenoma on last colonoscopy (less than 1 year                            ago) Providers:                Katrinka Blazing, Edrick Kins, RN, Pandora Leiter,                            Technician Referring MD:             Katrinka Blazing Medicines:                Monitored Anesthesia Care Complications:            No immediate complications. Estimated Blood Loss:     Estimated blood loss: none. Procedure:                Pre-Anesthesia Assessment:                           - Prior to the procedure, a History and Physical                            was performed, and patient medications, allergies                            and sensitivities were reviewed. The patient's                            tolerance of previous anesthesia was reviewed.                           - The risks and benefits of the procedure and the                            sedation options and risks were discussed with the                            patient. All questions were answered and informed                            consent was obtained.                           - ASA Grade Assessment: II - A patient with mild                            systemic disease.  After obtaining informed consent, the colonoscope                            was passed under direct vision. Throughout the                            procedure, the patient's blood pressure, pulse, and                            oxygen saturations were monitored continuously. The                            PCF-HQ190L ([id number]) scope was introduced through                             the anus and advanced to the the cecum, identified                            by appendiceal orifice and ileocecal valve. The                            colonoscopy was performed without difficulty. The                            patient tolerated the procedure well. The quality                            of the bowel preparation was adequate. Scope In: 10:33:57 AM Scope Out: 11:07:17 AM Scope Withdrawal Time: 0 hours 30 minutes 16 seconds  Total Procedure Duration: 0 hours 33 minutes 20 seconds  Findings:      The perianal and digital rectal examinations were normal.      A 5 mm polyp was found in the cecum. The polyp was multi-lobulated. This       was at the site of previous polypectomy. The polyp was removed with a       hot snare. Resection and retrieval were complete.      A 1 mm polyp was found in the cecum. The polyp was sessile. The polyp       was removed with a cold biopsy forceps. Resection and retrieval were       complete.      A single small angiodysplastic lesion without bleeding was found in the       ascending colon.      A 5 mm polyp was found in the transverse colon. The polyp was sessile.       The polyp was removed with a cold snare. Resection and retrieval were       complete.      Three sessile polyps were found in the descending colon. The polyps were       3 to 6 mm in size. These polyps were removed with a cold snare.       Resection and retrieval were complete, but one polyp was not retrievec.      A few small-mouthed diverticula were found in the sigmoid colon and       descending colon.      Non-bleeding  internal hemorrhoids were found during retroflexion. The       hemorrhoids were small. Impression:               - One 5 mm polyp in the cecum, removed with a hot                            snare. Resected and retrieved.                           - One 1 mm polyp in the cecum, removed with a cold                            biopsy forceps. Resected  and retrieved.                           - A single non-bleeding colonic angiodysplastic                            lesion.                           - One 5 mm polyp in the transverse colon, removed                            with a cold snare. Resected and retrieved.                           - Three 3 to 6 mm polyps in the descending colon,                            removed with a cold snare. Resected and retrieved.                           - Diverticulosis in the sigmoid colon and in the                            descending colon.                           - Non-bleeding internal hemorrhoids. Moderate Sedation:      Per Anesthesia Care Recommendation:           - Discharge patient to home (ambulatory).                           - Resume previous diet.                           - Await pathology results.                           - Repeat colonoscopy in 3 years for surveillance. Procedure Code(s):        --- Professional ---  28413, Colonoscopy, flexible; with removal of                            tumor(s), polyp(s), or other lesion(s) by snare                            technique                           45380, 59, Colonoscopy, flexible; with biopsy,                            single or multiple Diagnosis Code(s):        --- Professional ---                           D12.0, Benign neoplasm of cecum                           D12.3, Benign neoplasm of transverse colon (hepatic                            flexure or splenic flexure)                           K55.20, Angiodysplasia of colon without hemorrhage                           D12.4, Benign neoplasm of descending colon                           K64.8, Other hemorrhoids                           Z09, Encounter for follow-up examination after                            completed treatment for conditions other than                            malignant neoplasm                           Z86.010,  Personal history of colonic polyps                           K57.30, Diverticulosis of large intestine without                            perforation or abscess without bleeding CPT copyright 2022 American Medical Association. All rights reserved. The codes documented in this report are preliminary and upon coder review may  be revised to meet current compliance requirements. Katrinka Blazing, MD Katrinka Blazing,  06/24/2023 11:20:10 AM This report has been signed electronically. Number of Addenda: 0

## 2023-06-24 NOTE — Discharge Instructions (Addendum)
You are being discharged to home.  Resume your previous diet.  We are waiting for your pathology results.  Your physician has recommended a repeat colonoscopy in three years for surveillance.  

## 2023-06-24 NOTE — Transfer of Care (Signed)
Immediate Anesthesia Transfer of Care Note  Patient: Marshel Golubski  Procedure(s) Performed: COLONOSCOPY WITH PROPOFOL POLYPECTOMY BIOPSY  Patient Location: Endoscopy Unit  Anesthesia Type:General  Level of Consciousness: drowsy, patient cooperative, and responds to stimulation  Airway & Oxygen Therapy: Patient Spontanous Breathing  Post-op Assessment: Report given to RN, Post -op Vital signs reviewed and stable, Patient moving all extremities X 4, and Patient able to stick tongue midline  Post vital signs: Reviewed  Last Vitals:  Vitals Value Taken Time  BP 126/77   Temp 97.6   Pulse 53   Resp 16   SpO2 98     Last Pain:  Vitals:   06/24/23 1030  TempSrc:   PainSc: 0-No pain      Patients Stated Pain Goal: 7 (06/24/23 0851)  Complications: No notable events documented.

## 2023-06-24 NOTE — H&P (Signed)
Walter Miranda is an 67 y.o. male.   Chief Complaint: History of colon polyps and history of piecemeal polypectomy HPI: 67 year old male with past medical history of anxiety, arthritis and prostate cancer, coming for history of colon polyps and piecemeal polypectomy.  The patient denies having any nausea, vomiting, fever, chills, hematochezia, melena, hematemesis, abdominal distention, abdominal pain, diarrhea, jaundice, pruritus or weight loss.  Patient had a colonoscopy in 2023, had piecemeal polypectomy of a polyp in the cecum.  Past Medical History:  Diagnosis Date   Anxiety    Arthritis    Cancer [facility])    prostate    Past Surgical History:  Procedure Laterality Date   CHOLECYSTECTOMY     COLONOSCOPY WITH PROPOFOL N/A 06/04/2022   Procedure: COLONOSCOPY WITH PROPOFOL;  Surgeon: Dolores Frame, MD;  Location: [facility];  Service: Gastroenterology;  Laterality: N/A;  820   galbladder surgery     left wrist surgery      arthritic fused  the bones plate placed    LYMPHADENECTOMY Bilateral 08/09/2020   Procedure: LYMPHADENECTOMY, PELVIC;  Surgeon: Heloise Purpura, MD;  Location: [facility] OR[facility];  Service: Urology;  Laterality: Bilateral;   POLYPECTOMY  06/04/2022   Procedure: POLYPECTOMY;  Surgeon: Dolores Frame, MD;  Location: [facility];  Service: Gastroenterology;;  cecal x3; ascending colon    ROBOT ASSISTED LAPAROSCOPIC RADICAL PROSTATECTOMY N/A 08/09/2020   Procedure: XI ROBOTIC ASSISTED LAPAROSCOPIC RADICAL PROSTATECTOMY LEVEL 2;  Surgeon: Heloise Purpura, MD;  Location: [facility];  Service: Urology;  Laterality: N/A;    Family History  Problem Relation Age of Onset   Colon cancer Neg Hx    Social History:  reports that he has never smoked. He has never used smokeless tobacco. He reports current alcohol use of about 6.0 standard drinks of alcohol per week. He reports current drug use. Drug: Marijuana.  Allergies: No Known Allergies  Medications Prior to Admission   Medication Sig Dispense Refill   cetirizine (ZYRTEC) 10 MG tablet Take 10 mg by mouth daily as needed for allergies.     DULoxetine (CYMBALTA) 60 MG capsule Take 60 mg by mouth daily.     ENBREL SURECLICK 50 MG/ML injection Inject 50 mg into the skin every Sunday.     ibuprofen (ADVIL) 200 MG tablet Take 200-400 mg by mouth at bedtime as needed for moderate pain or headache.      No results found for this or any previous visit (from the past 48 hour(s)). No results found.  Review of Systems  All other systems reviewed and are negative.   Blood pressure (!) 147/84, pulse (!) 58, temperature 98.5 F (36.9 C), temperature source Oral, resp. rate 16, height 6' (1.829 m), weight 84.8 kg, SpO2 98 %. Physical Exam  GENERAL: The patient is AO x3, in no acute distress. HEENT: Head is normocephalic and atraumatic. EOMI are intact. Mouth is well hydrated and without lesions. NECK: Supple. No masses LUNGS: Clear to auscultation. No presence of rhonchi/wheezing/rales. Adequate chest expansion HEART: RRR, normal s1 and s2. ABDOMEN: Soft, nontender, no guarding, no peritoneal signs, and nondistended. BS +. No masses. EXTREMITIES: Without any cyanosis, clubbing, rash, lesions or edema. NEUROLOGIC: AOx3, no focal motor deficit. SKIN: no jaundice, no rashes  Assessment/Plan 67 year old male with past medical history of anxiety, arthritis and prostate cancer, coming for history of colon polyps and piecemeal polypectomy.  Will proceed with colonoscopy.  Dolores Frame, MD 06/24/2023, 9:06 AM

## 2023-06-24 NOTE — Anesthesia Preprocedure Evaluation (Signed)
Anesthesia Evaluation  Patient identified by MRN, date of birth, ID band Patient awake    Reviewed: Allergy & Precautions, H&P , NPO status , Patient's Chart, lab work & pertinent test results, reviewed documented beta blocker date and time   Airway Mallampati: II  TM Distance: >3 FB Neck ROM: full    Dental no notable dental hx.    Pulmonary neg pulmonary ROS   Pulmonary exam normal breath sounds clear to auscultation       Cardiovascular Exercise Tolerance: Good negative cardio ROS  Rhythm:regular Rate:Normal     Neuro/Psych   Anxiety     negative neurological ROS  negative psych ROS   GI/Hepatic negative GI ROS, Neg liver ROS,,,  Endo/Other  negative endocrine ROS    Renal/GU negative Renal ROS  negative genitourinary   Musculoskeletal   Abdominal   Peds  Hematology negative hematology ROS (+)   Anesthesia Other Findings   Reproductive/Obstetrics negative OB ROS                             Anesthesia Physical Anesthesia Plan  ASA: 2  Anesthesia Plan: General   Post-op Pain Management:    Induction:   PONV Risk Score and Plan: Propofol infusion  Airway Management Planned:   Additional Equipment:   Intra-op Plan:   Post-operative Plan:   Informed Consent: I have reviewed the patients History and Physical, chart, labs and discussed the procedure including the risks, benefits and alternatives for the proposed anesthesia with the patient or authorized representative who has indicated his/her understanding and acceptance.     Dental Advisory Given  Plan Discussed with: CRNA  Anesthesia Plan Comments:        Anesthesia Quick Evaluation  

## 2023-06-26 LAB — SURGICAL PATHOLOGY

## 2023-06-26 NOTE — Anesthesia Postprocedure Evaluation (Signed)
Anesthesia Post Note  Patient: Demonta Bumgardner  Procedure(s) Performed: COLONOSCOPY WITH PROPOFOL POLYPECTOMY BIOPSY  Patient location during evaluation: Phase II Anesthesia Type: General Level of consciousness: awake Pain management: pain level controlled Vital Signs Assessment: post-procedure vital signs reviewed and stable Respiratory status: spontaneous breathing and respiratory function stable Cardiovascular status: blood pressure returned to baseline and stable Postop Assessment: no headache and no apparent nausea or vomiting Anesthetic complications: no Comments: Late entry   No notable events documented.   Last Vitals:  Vitals:   06/24/23 0851 06/24/23 1111  BP: (!) 147/84 128/77  Pulse: (!) 58 (!) 57  Resp: 16 17  Temp: 36.9 C (!) 36.4 C  SpO2: 98% 98%    Last Pain:  Vitals:   06/24/23 1111  TempSrc: Axillary  PainSc:                  Windell [place]

## 2023-06-29 ENCOUNTER — Encounter (HOSPITAL_COMMUNITY): Payer: Self-pay | Admitting: Gastroenterology

## 2023-06-29 ENCOUNTER — Encounter (INDEPENDENT_AMBULATORY_CARE_PROVIDER_SITE_OTHER): Payer: Self-pay | Admitting: *Deleted
